# Patient Record
Sex: Female | Born: 1985 | Race: Black or African American | Hispanic: No | Marital: Single | Smoking: Current every day smoker
Health system: Southern US, Community
[De-identification: ages and names within clinical notes are randomized; demographics above are authoritative.]

## PROBLEM LIST (undated history)

## (undated) DIAGNOSIS — F419 Anxiety disorder, unspecified: Secondary | ICD-10-CM

## (undated) DIAGNOSIS — M419 Scoliosis, unspecified: Secondary | ICD-10-CM

## (undated) DIAGNOSIS — F209 Schizophrenia, unspecified: Secondary | ICD-10-CM

## (undated) HISTORY — PX: TUBAL LIGATION: SHX77

---

## 2012-10-26 ENCOUNTER — Encounter (HOSPITAL_COMMUNITY): Payer: Self-pay | Admitting: Emergency Medicine

## 2012-10-26 ENCOUNTER — Inpatient Hospital Stay (HOSPITAL_COMMUNITY)
Admission: AD | Admit: 2012-10-26 | Discharge: 2012-10-26 | Disposition: A | Discharge status: Home / Self Care | Payer: Medicare Other | Source: Ambulatory Visit | Attending: Emergency Medicine | Admitting: Emergency Medicine

## 2012-10-26 DIAGNOSIS — H538 Other visual disturbances: Secondary | ICD-10-CM | POA: Insufficient documentation

## 2012-10-26 DIAGNOSIS — M79609 Pain in unspecified limb: Secondary | ICD-10-CM | POA: Insufficient documentation

## 2012-10-26 DIAGNOSIS — F329 Major depressive disorder, single episode, unspecified: Secondary | ICD-10-CM | POA: Insufficient documentation

## 2012-10-26 DIAGNOSIS — R110 Nausea: Secondary | ICD-10-CM | POA: Insufficient documentation

## 2012-10-26 DIAGNOSIS — F17200 Nicotine dependence, unspecified, uncomplicated: Secondary | ICD-10-CM | POA: Insufficient documentation

## 2012-10-26 DIAGNOSIS — F209 Schizophrenia, unspecified: Secondary | ICD-10-CM | POA: Insufficient documentation

## 2012-10-26 DIAGNOSIS — Z3202 Encounter for pregnancy test, result negative: Secondary | ICD-10-CM | POA: Insufficient documentation

## 2012-10-26 DIAGNOSIS — F3289 Other specified depressive episodes: Secondary | ICD-10-CM | POA: Insufficient documentation

## 2012-10-26 DIAGNOSIS — Z91040 Latex allergy status: Secondary | ICD-10-CM | POA: Insufficient documentation

## 2012-10-26 DIAGNOSIS — F410 Panic disorder [episodic paroxysmal anxiety] without agoraphobia: Secondary | ICD-10-CM | POA: Insufficient documentation

## 2012-10-26 DIAGNOSIS — R42 Dizziness and giddiness: Secondary | ICD-10-CM | POA: Insufficient documentation

## 2012-10-26 DIAGNOSIS — R209 Unspecified disturbances of skin sensation: Secondary | ICD-10-CM | POA: Insufficient documentation

## 2012-10-26 DIAGNOSIS — Z862 Personal history of diseases of the blood and blood-forming organs and certain disorders involving the immune mechanism: Secondary | ICD-10-CM | POA: Insufficient documentation

## 2012-10-26 HISTORY — DX: Schizophrenia, unspecified: F20.9

## 2012-10-26 HISTORY — DX: Anxiety disorder, unspecified: F41.9

## 2012-10-26 LAB — URINALYSIS, ROUTINE W REFLEX MICROSCOPIC
Bilirubin Urine: NEGATIVE
Glucose, UA: NEGATIVE mg/dL
Ketones, ur: NEGATIVE mg/dL
Nitrite: NEGATIVE
Specific Gravity, Urine: 1.015 (ref 1.005–1.030)
pH: 7 (ref 5.0–8.0)

## 2012-10-26 MED ORDER — LORAZEPAM 1 MG PO TABS
1.0000 mg | ORAL_TABLET | Freq: Once | ORAL | Status: AC
  Administered 2012-10-26: 1 mg via ORAL
  Filled 2012-10-26: qty 1

## 2012-10-26 NOTE — MAU Note (Signed)
Pt in with "anxiety attack". Respirations short and shallow. Pt. Not verbally responsive to questions at this time. Some partial words uttered but not complete responsive. Pt. Able to get out of wheelchair and get onto stretcher. Encouraged to take slow, deep breaths.

## 2012-10-26 NOTE — ED Notes (Signed)
MD at bedside. 
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
## 2012-10-26 NOTE — MAU Note (Signed)
Pt eating crackers, drinking ginger ale.
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
## 2012-10-26 NOTE — MAU Note (Signed)
Pt's sibling being seen at clinic.  Pt has GAD, shaking- brought up to MAU for eval.  Pt in wc- taken directly to rm by tech and RN

## 2012-10-26 NOTE — ED Provider Notes (Signed)
CSN: [id number]     Arrival date & time 10/26/12  8469 History   First MD Initiated Contact with Patient 10/26/12 1134     Chief Complaint  Patient presents with  . Panic Attack   (Consider location/radiation/quality/duration/timing/severity/associated sxs/prior Treatment) Patient is a 27 y.o. female presenting with mental health disorder.  Mental Health Problem Presenting symptoms: no suicidal thoughts, no suicidal threats and no suicide attempt   Presenting symptoms comment:  Panic Attack  Degree of incapacity (severity):  Mild Onset quality:  Sudden Timing:  Constant Progression:  Improving Chronicity:  Chronic Context: stressful life event   Context: not noncompliant   Treatment compliance:  Some of the time Relieved by:  Nothing Worsened by:  Nothing tried Ineffective treatments:  None tried Associated symptoms: no abdominal pain and no chest pain     Past Medical History  Diagnosis Date  . Schizophrenia   . Anxiety    Past Surgical History  Procedure Laterality Date  . Tubal ligation     Family History  Problem Relation Age of Onset  . Bone cancer Mother   . Anxiety disorder Father    History  Substance Use Topics  . Smoking status: Current Every Day Smoker -- 3.00 packs/day    Types: Cigarettes  . Smokeless tobacco: Never Used  . Alcohol Use: No   OB History   Grav Para Term Preterm Abortions TAB SAB Ect Mult Living                 Review of Systems  Constitutional: Negative for fever.  Respiratory: Negative for cough and shortness of breath.   Cardiovascular: Negative for chest pain.  Gastrointestinal: Negative for vomiting and abdominal pain.  Psychiatric/Behavioral: Negative for suicidal ideas.  All other systems reviewed and are negative.    Allergies  Latex  Home Medications  No current outpatient prescriptions on file. BP 105/57  Pulse 68  Temp(Src) 98.1 F (36.7 C) (Oral)  Resp 16  Ht 5' (1.524 m)  Wt 115 lb (52.164 kg)  BMI  22.46 kg/m2  SpO2 100%  LMP 09/04/2012 Physical Exam  Nursing note and vitals reviewed. Constitutional: She is oriented to person, place, and time. She appears well-developed and well-nourished. No distress.  HENT:  Head: Normocephalic and atraumatic.  Eyes: EOM are normal. Pupils are equal, round, and reactive to light.  Neck: Normal range of motion. Neck supple.  Cardiovascular: Normal rate and regular rhythm.  Exam reveals no friction rub.   No murmur heard. Pulmonary/Chest: Effort normal and breath sounds normal. No respiratory distress. She has no wheezes. She has no rales.  Abdominal: Soft. She exhibits no distension. There is no tenderness. There is no rebound.  Musculoskeletal: Normal range of motion. She exhibits no edema.  Neurological: She is alert and oriented to person, place, and time.  Skin: No rash noted. She is not diaphoretic.    ED Course  Procedures (including critical care time) Labs Review Labs Reviewed  URINALYSIS, ROUTINE W REFLEX MICROSCOPIC  POCT PREGNANCY, URINE   Imaging Review No results found.  EKG Interpretation   None       MDM   1. Panic attack    76F sent from [facility] for concerns of anxiety. Was not being seen as a patient, her sister was being seen and argument broke out. It triggered patient's anxiety. She is calm, cooperative here, states she is feeling a little better. Mild sharp central chest pain, states terrible sharp central chest pain when  she has her other anxiety attacks.  Will give Ativan for her anxiety. After Ativan, resting comfortably, states she is feeling better. Denies any SI/HI. Given f/u resources.   Dagmar Hait, MD 10/26/12 1309
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
## 2012-10-26 NOTE — MAU Note (Signed)
Patient states a diagnosis of schizophrenia at [facility]. States inherited from father.

## 2012-12-01 ENCOUNTER — Emergency Department (HOSPITAL_COMMUNITY)
Admission: EM | Admit: 2012-12-01 | Discharge: 2012-12-01 | Discharge status: Against Medical Advice | Payer: Medicare Other | Attending: Emergency Medicine | Admitting: Emergency Medicine

## 2012-12-01 ENCOUNTER — Encounter (HOSPITAL_COMMUNITY): Payer: Self-pay | Admitting: Emergency Medicine

## 2012-12-01 DIAGNOSIS — F29 Unspecified psychosis not due to a substance or known physiological condition: Secondary | ICD-10-CM | POA: Insufficient documentation

## 2012-12-01 DIAGNOSIS — Z3202 Encounter for pregnancy test, result negative: Secondary | ICD-10-CM | POA: Insufficient documentation

## 2012-12-01 DIAGNOSIS — F17200 Nicotine dependence, unspecified, uncomplicated: Secondary | ICD-10-CM | POA: Diagnosis not present

## 2012-12-01 LAB — COMPREHENSIVE METABOLIC PANEL
AST: 17 U/L (ref 0–37)
Albumin: 4.4 g/dL (ref 3.5–5.2)
BUN: 8 mg/dL (ref 6–23)
CO2: 25 mEq/L (ref 19–32)
Calcium: 10.1 mg/dL (ref 8.4–10.5)
Chloride: 105 mEq/L (ref 96–112)
Creatinine, Ser: 0.95 mg/dL (ref 0.50–1.10)
GFR calc Af Amer: >90 mL/min (ref >90)
GFR calc non Af Amer: 83 mL/min — ABNORMAL LOW (ref >90)
Glucose, Bld: 79 mg/dL (ref 70–99)
Total Bilirubin: 0.5 mg/dL (ref 0.3–1.2)

## 2012-12-01 LAB — URINALYSIS, ROUTINE W REFLEX MICROSCOPIC
Glucose, UA: NEGATIVE mg/dL
Hgb urine dipstick: NEGATIVE
Leukocytes, UA: NEGATIVE
Nitrite: NEGATIVE
Protein, ur: NEGATIVE mg/dL
Specific Gravity, Urine: 1.027 (ref 1.005–1.030)
Urobilinogen, UA: 1 mg/dL (ref 0.0–1.0)

## 2012-12-01 LAB — CBC WITH DIFFERENTIAL/PLATELET
Basophils Absolute: 0 10*3/uL (ref 0.0–0.1)
Basophils Relative: 0 % (ref 0–1)
HCT: 39.9 % (ref 36.0–46.0)
Hemoglobin: 13.4 g/dL (ref 12.0–15.0)
Lymphocytes Relative: 60 % — ABNORMAL HIGH (ref 12–46)
MCHC: 33.6 g/dL (ref 30.0–36.0)
Monocytes Relative: 7 % (ref 3–12)
Neutro Abs: 1.6 10*3/uL — ABNORMAL LOW (ref 1.7–7.7)
Neutrophils Relative %: 31 % — ABNORMAL LOW (ref 43–77)
RBC: 5.03 MIL/uL (ref 3.87–5.11)
RDW: 15.6 % — ABNORMAL HIGH (ref 11.5–15.5)
WBC: 5.2 10*3/uL (ref 4.0–10.5)

## 2012-12-01 LAB — RAPID URINE DRUG SCREEN, HOSP PERFORMED
Amphetamines: NOT DETECTED
Benzodiazepines: NOT DETECTED
Cocaine: NOT DETECTED
Opiates: NOT DETECTED
Tetrahydrocannabinol: POSITIVE — AB

## 2012-12-01 LAB — PREGNANCY, URINE: Preg Test, Ur: NEGATIVE

## 2012-12-01 NOTE — ED Notes (Signed)
Called x's 3 without response 

## 2012-12-01 NOTE — ED Notes (Signed)
The pt reports that her head is messed up and she cannot seem to stop the images.  She has a child with her

## 2013-12-01 ENCOUNTER — Encounter (HOSPITAL_COMMUNITY): Payer: Self-pay | Admitting: Emergency Medicine

## 2013-12-01 ENCOUNTER — Emergency Department (HOSPITAL_COMMUNITY)
Admission: EM | Admit: 2013-12-01 | Discharge: 2013-12-01 | Disposition: A | Discharge status: Home / Self Care | Payer: Medicare Other | Attending: Emergency Medicine | Admitting: Emergency Medicine

## 2013-12-01 DIAGNOSIS — Z9851 Tubal ligation status: Secondary | ICD-10-CM | POA: Diagnosis not present

## 2013-12-01 DIAGNOSIS — Z91040 Latex allergy status: Secondary | ICD-10-CM | POA: Insufficient documentation

## 2013-12-01 DIAGNOSIS — Z720 Tobacco use: Secondary | ICD-10-CM | POA: Insufficient documentation

## 2013-12-01 DIAGNOSIS — Z8659 Personal history of other mental and behavioral disorders: Secondary | ICD-10-CM | POA: Diagnosis not present

## 2013-12-01 DIAGNOSIS — K602 Anal fissure, unspecified: Secondary | ICD-10-CM | POA: Diagnosis not present

## 2013-12-01 DIAGNOSIS — R1084 Generalized abdominal pain: Secondary | ICD-10-CM | POA: Insufficient documentation

## 2013-12-01 DIAGNOSIS — R112 Nausea with vomiting, unspecified: Secondary | ICD-10-CM | POA: Diagnosis not present

## 2013-12-01 DIAGNOSIS — K625 Hemorrhage of anus and rectum: Secondary | ICD-10-CM | POA: Diagnosis present

## 2013-12-01 DIAGNOSIS — K644 Residual hemorrhoidal skin tags: Secondary | ICD-10-CM | POA: Diagnosis not present

## 2013-12-01 LAB — I-STAT BETA HCG BLOOD, ED (MC, WL, AP ONLY): I-stat hCG, quantitative: <5 m[IU]/mL (ref <5)

## 2013-12-01 LAB — HEPATIC FUNCTION PANEL
ALT: 17 U/L (ref 0–35)
AST: 21 U/L (ref 0–37)
Albumin: 3.3 g/dL — ABNORMAL LOW (ref 3.5–5.2)
Alkaline Phosphatase: 42 U/L (ref 39–117)
BILIRUBIN TOTAL: 0.2 mg/dL — AB (ref 0.3–1.2)
Total Protein: 6.8 g/dL (ref 6.0–8.3)

## 2013-12-01 LAB — URINALYSIS, ROUTINE W REFLEX MICROSCOPIC
Glucose, UA: NEGATIVE mg/dL
KETONES UR: 15 mg/dL — AB
Leukocytes, UA: NEGATIVE
NITRITE: NEGATIVE
PROTEIN: 30 mg/dL — AB
SPECIFIC GRAVITY, URINE: 1.036 — AB (ref 1.005–1.030)
UROBILINOGEN UA: 1 mg/dL (ref 0.0–1.0)
pH: 6 (ref 5.0–8.0)

## 2013-12-01 LAB — BASIC METABOLIC PANEL
Anion gap: 14 (ref 5–15)
BUN: 9 mg/dL (ref 6–23)
CALCIUM: 9.4 mg/dL (ref 8.4–10.5)
CO2: 24 meq/L (ref 19–32)
CREATININE: 0.9 mg/dL (ref 0.50–1.10)
Chloride: 105 mEq/L (ref 96–112)
GFR calc Af Amer: >90 mL/min (ref >90)
GFR calc non Af Amer: 87 mL/min — ABNORMAL LOW (ref >90)
Glucose, Bld: 96 mg/dL (ref 70–99)
Potassium: 3.7 mEq/L (ref 3.7–5.3)
Sodium: 143 mEq/L (ref 137–147)

## 2013-12-01 LAB — CBC WITH DIFFERENTIAL/PLATELET
BASOS ABS: 0 10*3/uL (ref 0.0–0.1)
Basophils Relative: 1 % (ref 0–1)
Eosinophils Absolute: 0.1 10*3/uL (ref 0.0–0.7)
Eosinophils Relative: 2 % (ref 0–5)
HCT: 34 % — ABNORMAL LOW (ref 36.0–46.0)
Hemoglobin: 11 g/dL — ABNORMAL LOW (ref 12.0–15.0)
LYMPHS PCT: 34 % (ref 12–46)
Lymphs Abs: 2.2 10*3/uL (ref 0.7–4.0)
MCH: 25.8 pg — ABNORMAL LOW (ref 26.0–34.0)
MCHC: 32.4 g/dL (ref 30.0–36.0)
MCV: 79.8 fL (ref 78.0–100.0)
MONO ABS: 0.7 10*3/uL (ref 0.1–1.0)
MONOS PCT: 11 % (ref 3–12)
Neutro Abs: 3.5 10*3/uL (ref 1.7–7.7)
Neutrophils Relative %: 52 % (ref 43–77)
Platelets: 219 10*3/uL (ref 150–400)
RBC: 4.26 MIL/uL (ref 3.87–5.11)
RDW: 16 % — AB (ref 11.5–15.5)
WBC: 6.5 10*3/uL (ref 4.0–10.5)

## 2013-12-01 LAB — URINE MICROSCOPIC-ADD ON

## 2013-12-01 LAB — LIPASE, BLOOD: Lipase: 32 U/L (ref 11–59)

## 2013-12-01 LAB — POC OCCULT BLOOD, ED: Fecal Occult Bld: POSITIVE — AB

## 2013-12-01 MED ORDER — SODIUM CHLORIDE 0.9 % IV BOLUS (SEPSIS)
1000.0000 mL | Freq: Once | INTRAVENOUS | Status: AC
  Administered 2013-12-01: 1000 mL via INTRAVENOUS

## 2013-12-01 MED ORDER — ONDANSETRON HCL 4 MG/2ML IJ SOLN
4.0000 mg | Freq: Once | INTRAMUSCULAR | Status: AC
Start: 2013-12-01 — End: 2013-12-01
  Administered 2013-12-01: 4 mg via INTRAVENOUS
  Filled 2013-12-01: qty 2

## 2013-12-01 MED ORDER — MORPHINE SULFATE 4 MG/ML IJ SOLN
4.0000 mg | Freq: Once | INTRAMUSCULAR | Status: AC
  Administered 2013-12-01: 4 mg via INTRAVENOUS
  Filled 2013-12-01: qty 1

## 2013-12-01 MED ORDER — DOCUSATE SODIUM 100 MG PO CAPS: 100.0000 mg | ORAL_CAPSULE | Freq: Two times a day (BID) | ORAL | Status: DC

## 2013-12-01 MED ORDER — OXYCODONE-ACETAMINOPHEN 5-325 MG PO TABS
1.0000 | ORAL_TABLET | Freq: Once | ORAL | Status: AC
  Administered 2013-12-01: 1 via ORAL
  Filled 2013-12-01: qty 1

## 2013-12-01 NOTE — Discharge Instructions (Signed)
Return to the emergency room with worsening of symptoms, new symptoms or with symptoms that are concerning, especially fevers, chills, severe abdominal pain, unable to keep down fluids, loosing large amounts of blood from rectum. Follow up with the wellness Center. Call to make an appointment soon as possible. Number provided above. Stool softener and fiber supplement such as Metamucil daily. Do not straining with defecation. Sitz bath after defecation. Instructions below.    Emergency Department Resource Guide 1) Find a Doctor and Pay Out of Pocket Although you won't have to find out who is covered by your insurance plan, it is a good idea to ask around and get recommendations. You will then need to call the office and see if the doctor you have chosen will accept you as a new patient and what types of options they offer for patients who are self-pay. Some doctors offer discounts or will set up payment plans for their patients who do not have insurance, but you will need to ask so you aren't surprised when you get to your appointment.  2) Contact Your Local Health Department Not all health departments have doctors that can see patients for sick visits, but many do, so it is worth a call to see if yours does. If you don't know where your local health department is, you can check in your phone book. The CDC also has a tool to help you locate your state's health department, and many state websites also have listings of all of their local health departments.  3) Find a Walk-in Clinic If your illness is not likely to be very severe or complicated, you may want to try a walk in clinic. These are popping up all over the country in pharmacies, drugstores, and shopping centers. They're usually staffed by nurse practitioners or physician assistants that have been trained to treat common illnesses and complaints. They're usually fairly quick and inexpensive. However, if you have serious medical issues or chronic  medical problems, these are probably not your best option.  No Primary Care Doctor: - Call Health Connect at  [phone] - they can help you locate a primary care doctor that  accepts your insurance, provides certain services, etc. - Physician Referral Service- [phone]  Chronic Pain Problems: Organization         Address  Phone   Notes  [facility]  [phone]) [phone] Patients need to be referred by their primary care doctor.   Medication Assistance: Organization         Address  Phone   Notes  [facility] [address] [place], [place] [address] [phone]) [phone] --Must be a resident of [facility] -- Must have NO insurance coverage whatsoever (no Medicaid/ Medicare, etc.) -- The pt. MUST have a primary care doctor that directs their care regularly and follows them in the community   Med[facility]ist  [phone]   [employer]  [phone]    Agencies that provide inexpensive medical care: Organization         Address  Phone   Notes  [facility]  [phone]) [phone]   [facility]    [phone]   [facility] [address] [place], [place] [address] [phone]) [phone]   [facility] [address], [place] [phone]   [facility]anned Parenthood    [phone](336) [phone]   [facility]    [phone](336) [phone]   [facility]  [address], [place] Phone:  [phone], Fax:  [phone] Hours of Operation:  9 am - 6 pm, M-F.  Also accepts Medicaid/Medicare and self-pay.  [facility]  [address], [place] Phone: [phone], Fax: [phone]) [phone]. Hours of Operation:  8:30 am - 5:30 pm, M-F.  Also accepts Medicaid and self-pay.  [facility] [place] [address], [place] Phone: [phone](336) [phone]   [facility] [address], [place], [place] [phone],  Ext. [phone] Mondays & Thursdays: 7-9 AM.  First 15 patients are seen on a first come, first serve basis.    Medicaid-accepting [facility] Providers:  Organization         Address  Phone   Notes  [facility] [address], [place] [phone]) [phone] Also accepts self-pay patients.  [facility] [address], [place]  [phone]) [phone]   [facility] [address], [place] [phone]   [facility] [address] [place] [address], [place] [phone]) [phone]   Renaye RakersVeita Bland [address], [place]   [phone]) [phone] Only accepts [place] Access [place] patients after they have their name applied to their card.   Self-Pay (no insurance) in [facility]:  Organization         Address  Phone   Notes  Sickle Cell Patients, [facility] [address], [place] [phone]   [facility] [address], [place] [phone]) [phone]   [facility] [place]  [address], [place] [phone]) [phone]   [facility]/Dr. Osei-Bonsu  [address] [place] [address], [place] or [id number], [place] [phone] Phone number for both [place] and [place] locations is the same.  [facility] [address], [place] [phone]   [facility] [address] [place] [address], [place] or [address] [phone]) [phone]   [facility] [address] [place] [address], [place] [phone], phone; [phone]) [phone], fax Sees patients 1st and 3rd Saturday of every month.  Must not qualify for public or private insurance (i.e. Medicaid, Medicare, [place] Health Choice, Veterans' Benefits)  Household income should be no more than 200% of the poverty level The clinic cannot treat you if you are pregnant or think you are pregnant  Sexually transmitted diseases are not  treated at the clinic.    Dental Care: Organization         Address  Phone  Notes  [facility] Department of [facility] [address], [place] [phone] Accepts children up to age 721 who are enrolled in [place] or [place] Health Choice; pregnant women with a Medicaid card; and children who have applied for Medicaid or [place] Health Choice, but were declined, whose parents can pay a reduced fee at time of service.  [facility] of [facility] [place]  [address], [place] [phone]) [phone] Accepts children up to age 26 who are enrolled in [place] or [place] Health Choice; pregnant women with a Medicaid card; and children who have applied for Medicaid or [place] Health Choice, but were declined, whose parents can pay a reduced fee at time of service.  [facility]  [phone] [address], [place] [phone] Patients are seen by appointment only. Walk-ins are not accepted. [facility] will see patients 75 years of age and older. Monday - Tuesday (8am-5pm) Most Wednesdays (8:30-5pm) $30 per visit, cash only  [facility] Access PROGRAM  [address], [facility] [phone] Patients are seen by appointment only. Walk-ins are not accepted. [facility] will see patients 45 years of age and older. One Wednesday Evening (Monthly: Volunteer Based).  $30 per visit, cash only  [employer]  [phone] for adults; Children under age 15, call Graduate Pediatric Dentistry at [phone]. Children aged 79-14, please call [phone] to request a pediatric application.  Dental services are provided in all areas of dental care including fillings, crowns and bridges, complete and partial dentures, implants, gum treatment, root canals, and extractions. Preventive care is also provided. Treatment is provided to both adults and children. Patients are selected via a lottery and there is  often a waiting list.   [facility] [address] [place]  [phone] [email or web address]   [facility] [address] [place], [place] [phone], Ext. [phone] Second and Fourth Thursday of each month, opens at 6:30 AM; Clinic ends at 9 AM.  Patients are seen on a first-come first-served basis, and a limited number are seen during each clinic.   [facility]  [address] [place], [place] [phone]   Eligibility Requirements You must have lived in [place], [place], or [place] counties for at least the last three months.   You cannot be eligible for state or federal sponsored [place], including [employer], [place], or [employer].   You generally cannot be eligible for healthcare insurance through your employer.    How to apply: Eligibility screenings are held every Tuesday and Wednesday afternoon from 1:00 pm until 4:00 pm. You do not need an appointment for the interview!  [facility] [address], [place], [place] [phone]   [facility] Health Department  [phone]   [facility] Health Department  [phone]   [facility] Department  [phone]    Behavioral Health Resources in the Community: Intensive Outpatient Programs Organization         Address  Phone  Notes  [facility] [address], [place], [place] [phone]   [facility] [address] [place], [place] [phone]   [facility] [address] [place], [place]  [phone]   [facility] [address],  [place], [place] [phone] or [phone]   Substance Abuse Resources Organization         Address  Phone  Notes  [facility]  [phone]   [facility]  [phone]   [facility] [place]  [phone]   Floydene Flock  [phone]   Residential & Outpatient Substance  Abuse Program  [phone]   Psychological Services Organization         Address  Phone  Notes  [facility]  [phone]   [facility]  [phone]   [facility] [address], [place] [phone] or [phone]    Mobile Crisis Teams Organization         Address  Phone  Notes  [facility], Mobile Crisis Care Unit  [phone]   [facility]  [address] [place], [place] [phone]   Jasmine December  DeEsch [address] [place] [phone]    Self-Help/Support Groups Organization         Address  Phone             Notes  [facility] [place] - variety of support groups  [phone]- I7437963 Call for more information  Narcotics Anonymous (NA), [facility] [address], [employer] [place]  2 meetings at this location   Statistician         Address  Phone  Notes  [facility] [address],    [place]  [phone]   [facility]  [address] [place] [address], [place], [place] [phone]   [facility] [address] [place], [place] [phone] Admissions: 8am-3pm M-F  [facility] [address]    [place], [place] [phone]   [facility] [address] [place], [place], [place] [phone]   [facility] [address]  [place], [place] [phone]   [facility] - Intensive Outpatient [address], [place], [place] [phone]   [facility] ([facility] [address]  [place], [place] [phone] or [phone]   [facility] ([facility]) [address], [place], [place] [phone] Accepts Medicaid  [facility] [address],  [place] [phone] Substance Abuse/Addiction Treatment   [facility] Organization          Address  Phone  Notes  [facility]  [phone]   Angie Fava, PhD [address]vin Knack [place], [place]   [phone] or [phone]   [facility]   [address] [place], [place] [phone]   [facility] [address] [place], [place] [phone] Insurance/Medicaid/sponsorship through [facility] and Families [address]                                    [place], [place] [phone] Therapy/tele-psych/case  [facility] [address], [place] [phone]    Dr. Lolly Mustache  [phone]   [facility]  [facility] Dept. 1) [address], [place] 2) [address] [place] 3)  [address], [place] [phone]  [phone]   [facility] Child Abuse Hotline (848)049-9944 or 937-152-7158 (After Hours)         Abdominal Pain, Women Abdominal (stomach, pelvic, or belly) pain can be caused by many things. It is important to tell your doctor:  The location of the pain.  Does it come and go or is it present all the time?  Are there things that start the pain (eating certain foods, exercise)?  Are there other symptoms associated with the pain (fever, nausea, vomiting, diarrhea)? All of this is helpful to know when trying to find the cause of the pain. CAUSES   Stomach: virus or bacteria infection, or ulcer.  Intestine: appendicitis (inflamed appendix), regional ileitis (Crohn's disease), ulcerative colitis (inflamed colon), irritable bowel syndrome, diverticulitis (inflamed diverticulum of the colon), or cancer of the stomach or intestine.  Gallbladder disease or stones in the gallbladder.  Kidney disease, kidney stones, or infection.  Pancreas infection or cancer.  Fibromyalgia (pain disorder).  Diseases of the female organs:  Uterus:  fibroid (non-cancerous) tumors or infection.  Fallopian tubes: infection or tubal  pregnancy.  Ovary: cysts or tumors.  Pelvic adhesions (scar tissue).  Endometriosis (uterus lining tissue growing in the pelvis and on the pelvic organs).  Pelvic congestion syndrome (female organs filling up with blood just before the menstrual period).  Pain with the menstrual period.  Pain with ovulation (producing an egg).  Pain with an IUD (intrauterine device, birth control) in the uterus.  Cancer of the female organs.  Functional pain (pain not caused by a disease, may improve without treatment).  Psychological pain.  Depression. DIAGNOSIS  Your doctor will decide the seriousness of your pain by doing an examination.  Blood tests.  X-rays.  Ultrasound.  CT scan (computed tomography, special type of X-ray).  MRI (magnetic resonance imaging).  Cultures, for infection.  Barium enema (dye inserted in the large intestine, to better view it with X-rays).  Colonoscopy (looking in intestine with a lighted tube).  Laparoscopy (minor surgery, looking in abdomen with a lighted tube).  Major abdominal exploratory surgery (looking in abdomen with a large incision). TREATMENT  The treatment will depend on the cause of the pain.   Many cases can be observed and treated at home.  Over-the-counter medicines recommended by your caregiver.  Prescription medicine.  Antibiotics, for infection.  Birth control pills, for painful periods or for ovulation pain.  Hormone treatment, for endometriosis.  Nerve blocking injections.  Physical therapy.  Antidepressants.  Counseling with a psychologist or psychiatrist.  Minor or major surgery. HOME CARE INSTRUCTIONS   Do not take laxatives, unless directed by your caregiver.  Take over-the-counter pain medicine only if ordered by your caregiver. Do not take aspirin because it can cause an upset stomach or bleeding.  Try a clear liquid diet (broth or water) as ordered by your caregiver. Slowly move to a bland diet, as  tolerated, if the pain is related to the stomach or intestine.  Have a thermometer and take your temperature several times a day, and record it.  Bed rest and sleep, if it helps the pain.  Avoid sexual intercourse, if it causes pain.  Avoid stressful situations.  Keep your follow-up appointments and tests, as your caregiver orders.  If the pain does not go away with medicine or surgery, you may try:  Acupuncture.  Relaxation exercises (yoga, meditation).  Group therapy.  Counseling. SEEK MEDICAL CARE IF:   You notice certain foods cause stomach pain.  Your home care treatment is not helping your pain.  You need stronger pain medicine.  You want your IUD removed.  You feel faint or lightheaded.  You develop nausea and vomiting.  You develop a rash.  You are having side effects or an allergy to your medicine. SEEK IMMEDIATE MEDICAL CARE IF:   Your pain does not go away or gets worse.  You have a fever.  Your pain is felt only in portions of the abdomen. The right side could possibly be appendicitis. The left lower portion of the abdomen could be colitis or diverticulitis.  You are passing blood in your stools (bright red or black tarry stools, with or without vomiting).  You have blood in your urine.  You develop chills, with or without a fever.  You pass out. MAKE SURE YOU:   Understand these instructions.  Will watch your condition.  Will get help right away if you are not doing well or get worse. Document Released: 11/01/2006 Document Revised: 05/21/2013 Document Reviewed: 11/21/2008 ExitCare Patient  Information 2015 [place], [place]. This information is not intended to replace advice given to you by your health care provider. Make sure you discuss any questions you have with your health care provider.    Anal Fissure, Adult An anal fissure is a small tear or crack in the skin around the anus. Bleeding from a fissure usually stops on its own within a  few minutes. However, bleeding will often reoccur with each bowel movement until the crack heals.  CAUSES   Passing large, hard stools.  Frequent diarrheal stools.  Constipation.  Inflammatory bowel disease (Crohn's disease or ulcerative colitis).  Infections.  Anal sex. SYMPTOMS   Small amounts of blood seen on your stools, on toilet paper, or in the toilet after a bowel movement.  Rectal bleeding.  Painful bowel movements.  Itching or irritation around the anus. DIAGNOSIS Your caregiver will examine the anal area. An anal fissure can usually be seen with careful inspection. A rectal exam may be performed and a short tube (anoscope) may be used to examine the anal canal. TREATMENT   You may be instructed to take fiber supplements. These supplements can soften your stool to help make bowel movements easier.  Sitz baths may be recommended to help heal the tear. Do not use soap in the sitz baths.  A medicated cream or ointment may be prescribed to lessen discomfort. HOME CARE INSTRUCTIONS   Maintain a diet high in fruits, whole grains, and vegetables. Avoid constipating foods like bananas and dairy products.  Take sitz baths as directed by your caregiver.  Drink enough fluids to keep your urine clear or pale yellow.  Only take over-the-counter or prescription medicines for pain, discomfort, or fever as directed by your caregiver. Do not take aspirin as this may increase bleeding.  Do not use ointments containing numbing medications (anesthetics) or hydrocortisone. They could slow healing. SEEK MEDICAL CARE IF:   Your fissure is not completely healed within 3 days.  You have further bleeding.  You have a fever.  You have diarrhea mixed with blood.  You have pain.  Your problem is getting worse rather than better. MAKE SURE YOU:   Understand these instructions.  Will watch your condition.  Will get help right away if you are not doing well or get  worse. Document Released: 01/04/2005 Document Revised: 03/29/2011 Document Reviewed: 06/21/2010 [facility] Patient Information 2015 [place], [place]. This information is not intended to replace advice given to you by your health care provider. Make sure you discuss any questions you have with your health care provider.

## 2013-12-01 NOTE — ED Notes (Addendum)
Pt reports rectal bleeding x 2 days when she uses the restroom. Also c/o abdominal pain and sts she vomited once. Pt is tearful in triage.

## 2013-12-01 NOTE — ED Notes (Signed)
PA at bedside completing rectal exam.

## 2013-12-01 NOTE — ED Provider Notes (Signed)
CSN: [id number]     Arrival date & time 12/01/13  0235 History   First MD Initiated Contact with Patient 12/01/13 [phone]     Chief Complaint  Patient presents with  . Rectal Bleeding     (Consider location/radiation/quality/duration/timing/severity/associated sxs/prior Treatment) HPI  Leslie Jarvis is a 28 y.o. female with PMH of anxiety presenting with 2 day history of rectal bleeding after defecation. Patient endorses history of constipation with hard stools and painful defecation. Patient says there is a small amount of blood on the toilet paper. At times her stools loose. This is also associated with a sharp diffuse abdominal pain that was intermittent and has become more constant. Patient has vomited once yesterday. Emesis was nonbloody nonbilious. Last bowel movement yesterday. Patient endorses mild burning with urination. She states this is normal for her. No hematuria. No foul smell to urine. No urgency or frequency. Patient denies vaginal complaints. She denies fevers but endorses chills. Patient denies chest pain, shortness of breath, lightheadedness or dizziness.   Past Medical History  Diagnosis Date  . Schizophrenia   . Anxiety    Past Surgical History  Procedure Laterality Date  . Tubal ligation     Family History  Problem Relation Age of Onset  . Bone cancer Mother   . Anxiety disorder Father    History  Substance Use Topics  . Smoking status: Current Every Day Smoker -- 3.00 packs/day    Types: Cigarettes  . Smokeless tobacco: Never Used  . Alcohol Use: No   OB History    No data available     Review of Systems  Constitutional: Negative for fever and chills.  HENT: Negative for congestion and rhinorrhea.   Eyes: Negative for visual disturbance.  Respiratory: Negative for cough and shortness of breath.   Cardiovascular: Negative for chest pain and palpitations.  Gastrointestinal: Positive for nausea, vomiting and blood in stool.  Genitourinary: Negative  for dysuria and hematuria.  Musculoskeletal: Negative for back pain and gait problem.  Skin: Negative for rash.  Neurological: Negative for weakness and headaches.      Allergies  Latex  Home Medications   Prior to Admission medications   Medication Sig Start Date End Date Taking? Authorizing Provider  docusate sodium (COLACE) 100 MG capsule Take 1 capsule (100 mg total) by mouth every 12 (twelve) hours. 12/01/13   Benetta SparVictoria L Dakin Madani, PA-C   BP 107/59 mmHg  Pulse 55  Temp(Src) 97.1 F (36.2 C) (Oral)  Resp 18  Ht 5' (1.524 m)  Wt 119 lb (53.978 kg)  BMI 23.24 kg/m2  SpO2 100%  LMP 11/30/2013 Physical Exam  Constitutional: She appears well-developed and well-nourished. No distress.  HENT:  Head: Normocephalic and atraumatic.  Mildly dry mucous membranes  Eyes: Conjunctivae and EOM are normal. Right eye exhibits no discharge. Left eye exhibits no discharge.  Cardiovascular: Normal rate, regular rhythm and normal heart sounds.   Pulmonary/Chest: Effort normal and breath sounds normal. No respiratory distress. She has no wheezes.  Abdominal: Soft. Bowel sounds are normal. She exhibits no distension.  Diffuse abdominal tenderness worse in left lower quadrant. No rebound, guarding, rigidity. No CVA tenderness.  Genitourinary:  External rectum without tenderness to palpation with 1cm fissure at 6'o clock. Pt also with small external hemorrhoid without tenderness, erythema, swelling, non thrombosed. Internal rectum with normal tone without masses or lesions. Hard stool in rectal vault. Discomfort with rectal exam. Nursing tech in room during exam.   Neurological: She is alert. She  exhibits normal muscle tone. Coordination normal.  Skin: Skin is warm and dry. She is not diaphoretic.  Nursing note and vitals reviewed.   ED Course  Procedures (including critical care time) Labs Review Labs Reviewed  CBC WITH DIFFERENTIAL - Abnormal; Notable for the following:    Hemoglobin 11.0  (*)    HCT 34.0 (*)    MCH 25.8 (*)    RDW 16.0 (*)    All other components within normal limits  BASIC METABOLIC PANEL - Abnormal; Notable for the following:    GFR calc non Af Amer 87 (*)    All other components within normal limits  HEPATIC FUNCTION PANEL - Abnormal; Notable for the following:    Albumin 3.3 (*)    Total Bilirubin 0.2 (*)    All other components within normal limits  URINALYSIS, ROUTINE W REFLEX MICROSCOPIC - Abnormal; Notable for the following:    Color, Urine AMBER (*)    Specific Gravity, Urine 1.036 (*)    Hgb urine dipstick LARGE (*)    Bilirubin Urine SMALL (*)    Ketones, ur 15 (*)    Protein, ur 30 (*)    All other components within normal limits  URINE MICROSCOPIC-ADD ON - Abnormal; Notable for the following:    Squamous Epithelial / LPF FEW (*)    Bacteria, UA FEW (*)    Crystals CA OXALATE CRYSTALS (*)    All other components within normal limits  POC OCCULT BLOOD, ED - Abnormal; Notable for the following:    Fecal Occult Bld POSITIVE (*)    All other components within normal limits  LIPASE, BLOOD  I-STAT BETA HCG BLOOD, ED ([facility], [facility], [facility] ONLY)    Imaging Review No results found.   EKG Interpretation None      Meds given in ED:  Medications  oxyCODONE-acetaminophen (PERCOCET/ROXICET) 5-325 MG per tablet 1 tablet (1 tablet Oral Given 12/01/13 0254)  sodium chloride 0.9 % bolus 1,000 mL (0 mLs Intravenous Stopped 12/01/13 0730)  ondansetron (ZOFRAN) injection 4 mg (4 mg Intravenous Given 12/01/13 0644)  morphine 4 MG/ML injection 4 mg (4 mg Intravenous Given 12/01/13 0648)    Discharge Medication List as of 12/01/2013  8:57 AM    START taking these medications   Details  docusate sodium (COLACE) 100 MG capsule Take 1 capsule (100 mg total) by mouth every 12 (twelve) hours., Starting 12/01/2013, Until Discontinued, Print          MDM   Final diagnoses:  Anal fissure  External hemorrhoid  Diffuse abdominal pain  Nausea and  vomiting, vomiting of unspecified type   Pt with history of small amount of blood on toilet paper after defecation with associated pain. This is gone on for 2 days. Pt with history of constipation. He should also with diffuse abdominal pain. Better after defecation and one episode of emesis yesterday nonbloody nonbilious. VSS. Mild diffuse abdominal tenderness without signs of peritonitis. Pt with small non thrombosed or inflammed external hemorrhoid and small non infected anal fissure. Hemoccult positive. Pt tolerating fluid in ED. Abdominal pain has completely resolved. Pt with benign abdomen. Labs reassuring. I doubt acute abdomen. Pt also with hematuria but pt on her period. Pt with few bacteria in urine and I suspect contaminant. Pt without new urinary symptom. Patient is afebrile, nontoxic, and in no acute distress. Patient is appropriate for outpatient management and is stable for discharge. Tx with sitz baths, stool softener and fiber. Patient without a PCP. Patient to  establish care and follow up. ED resources provided. Pt appears reliable for follow up and is agreeable to discharge.   Discussed return precautions with patient. Discussed all results and patient verbalizes understanding and agrees with plan.  Case has been discussed with Dr. Deretha Emory who agrees with the above plan and to discharge.       Louann Sjogren, PA-C 12/01/13 1610  Loren Racer, MD 12/02/13 [phone]

## 2014-01-14 ENCOUNTER — Encounter (HOSPITAL_COMMUNITY): Payer: Self-pay | Admitting: Family Medicine

## 2014-01-14 ENCOUNTER — Emergency Department (HOSPITAL_COMMUNITY)
Admission: EM | Admit: 2014-01-14 | Discharge: 2014-01-14 | Discharge status: Against Medical Advice | Payer: Medicare Other | Attending: Emergency Medicine | Admitting: Emergency Medicine

## 2014-01-14 DIAGNOSIS — R109 Unspecified abdominal pain: Secondary | ICD-10-CM | POA: Diagnosis not present

## 2014-01-14 DIAGNOSIS — Z720 Tobacco use: Secondary | ICD-10-CM | POA: Insufficient documentation

## 2014-01-14 DIAGNOSIS — R197 Diarrhea, unspecified: Secondary | ICD-10-CM | POA: Insufficient documentation

## 2014-01-14 LAB — CBC WITH DIFFERENTIAL/PLATELET
BASOS ABS: 0 10*3/uL (ref 0.0–0.1)
BASOS PCT: 0 % (ref 0–1)
Eosinophils Absolute: 0 10*3/uL (ref 0.0–0.7)
Eosinophils Relative: 1 % (ref 0–5)
HCT: 36.5 % (ref 36.0–46.0)
Hemoglobin: 12.1 g/dL (ref 12.0–15.0)
Lymphocytes Relative: 31 % (ref 12–46)
Lymphs Abs: 1.7 10*3/uL (ref 0.7–4.0)
MCH: 26 pg (ref 26.0–34.0)
MCHC: 33.2 g/dL (ref 30.0–36.0)
MCV: 78.5 fL (ref 78.0–100.0)
MONOS PCT: 13 % — AB (ref 3–12)
Monocytes Absolute: 0.7 10*3/uL (ref 0.1–1.0)
NEUTROS ABS: 3.1 10*3/uL (ref 1.7–7.7)
NEUTROS PCT: 55 % (ref 43–77)
PLATELETS: 181 10*3/uL (ref 150–400)
RBC: 4.65 MIL/uL (ref 3.87–5.11)
RDW: 16.5 % — AB (ref 11.5–15.5)
WBC: 5.6 10*3/uL (ref 4.0–10.5)

## 2014-01-14 LAB — COMPREHENSIVE METABOLIC PANEL
ALBUMIN: 3.3 g/dL — AB (ref 3.5–5.2)
ALK PHOS: 39 U/L (ref 39–117)
ALT: 13 U/L (ref 0–35)
AST: 18 U/L (ref 0–37)
Anion gap: 5 (ref 5–15)
BUN: 8 mg/dL (ref 6–23)
CALCIUM: 9.3 mg/dL (ref 8.4–10.5)
CO2: 27 mmol/L (ref 19–32)
Chloride: 109 mEq/L (ref 96–112)
Creatinine, Ser: 0.9 mg/dL (ref 0.50–1.10)
GFR calc Af Amer: >90 mL/min (ref >90)
GFR calc non Af Amer: 87 mL/min — ABNORMAL LOW (ref >90)
Glucose, Bld: 96 mg/dL (ref 70–99)
POTASSIUM: 4 mmol/L (ref 3.5–5.1)
SODIUM: 141 mmol/L (ref 135–145)
Total Bilirubin: 0.6 mg/dL (ref 0.3–1.2)
Total Protein: 6.7 g/dL (ref 6.0–8.3)

## 2014-01-14 NOTE — ED Notes (Signed)
Pt sts abd pain, diarrhea and rectal bleeding that started 3 days ago. sts also vomiting.

## 2014-03-26 ENCOUNTER — Emergency Department (HOSPITAL_COMMUNITY)
Admission: EM | Admit: 2014-03-26 | Discharge: 2014-03-26 | Disposition: A | Discharge status: Home / Self Care | Payer: Medicare Other | Attending: Emergency Medicine | Admitting: Emergency Medicine

## 2014-03-26 ENCOUNTER — Encounter (HOSPITAL_COMMUNITY): Payer: Self-pay | Admitting: Emergency Medicine

## 2014-03-26 DIAGNOSIS — M419 Scoliosis, unspecified: Secondary | ICD-10-CM | POA: Diagnosis not present

## 2014-03-26 DIAGNOSIS — F419 Anxiety disorder, unspecified: Secondary | ICD-10-CM | POA: Insufficient documentation

## 2014-03-26 DIAGNOSIS — Z79899 Other long term (current) drug therapy: Secondary | ICD-10-CM | POA: Diagnosis not present

## 2014-03-26 DIAGNOSIS — Z3202 Encounter for pregnancy test, result negative: Secondary | ICD-10-CM | POA: Diagnosis not present

## 2014-03-26 DIAGNOSIS — Z91040 Latex allergy status: Secondary | ICD-10-CM | POA: Diagnosis not present

## 2014-03-26 DIAGNOSIS — Z720 Tobacco use: Secondary | ICD-10-CM | POA: Insufficient documentation

## 2014-03-26 HISTORY — DX: Scoliosis, unspecified: M41.9

## 2014-03-26 LAB — I-STAT CHEM 8, ED
BUN: 8 mg/dL (ref 6–23)
Calcium, Ion: 1.22 mmol/L (ref 1.12–1.23)
Chloride: 104 mmol/L (ref 96–112)
Creatinine, Ser: 1 mg/dL (ref 0.50–1.10)
GLUCOSE: 88 mg/dL (ref 70–99)
HEMATOCRIT: 42 % (ref 36.0–46.0)
Hemoglobin: 14.3 g/dL (ref 12.0–15.0)
Potassium: 3.8 mmol/L (ref 3.5–5.1)
Sodium: 144 mmol/L (ref 135–145)
TCO2: 24 mmol/L (ref 0–100)

## 2014-03-26 LAB — POC URINE PREG, ED: Preg Test, Ur: NEGATIVE

## 2014-03-26 MED ORDER — LORAZEPAM 1 MG PO TABS
1.0000 mg | ORAL_TABLET | Freq: Once | ORAL | Status: AC
  Administered 2014-03-26: 1 mg via ORAL
  Filled 2014-03-26: qty 1

## 2014-03-26 NOTE — ED Notes (Addendum)
Per EMS pt comes from substance abuse facility, staff there didn't know pt.  Pt c/o anxiety, and her vision started to blur when they pulled in our parking lot. Vitals 130/90 Pt states that she having issues with her ex boyfriend, her landlord and [address]. Pt tearful.

## 2014-03-26 NOTE — Discharge Instructions (Signed)

## 2014-03-26 NOTE — ED Notes (Signed)
Neuro intact.

## 2014-03-26 NOTE — Progress Notes (Signed)
Confirmed pcp as [facility] EPIC updated

## 2014-03-26 NOTE — ED Provider Notes (Signed)
CSN: [id number]     Arrival date & time 03/26/14  1353 History   First MD Initiated Contact with Patient 03/26/14 1505     Chief Complaint  Patient presents with  . Anxiety     (Consider location/radiation/quality/duration/timing/severity/associated sxs/prior Treatment) Patient is a 29 y.o. female presenting with anxiety. The history is provided by the patient.  Anxiety This is a chronic problem. The current episode started 3 to 5 hours ago. The problem occurs hourly. The problem has been gradually improving. Pertinent negatives include no abdominal pain and no shortness of breath. Nothing aggravates the symptoms. Nothing relieves the symptoms. She has tried nothing for the symptoms.    Past Medical History  Diagnosis Date  . Schizophrenia   . Anxiety   . Scoliosis    Past Surgical History  Procedure Laterality Date  . Tubal ligation     Family History  Problem Relation Age of Onset  . Bone cancer Mother   . Anxiety disorder Father    History  Substance Use Topics  . Smoking status: Current Every Day Smoker -- 3.00 packs/day    Types: Cigarettes  . Smokeless tobacco: Never Used  . Alcohol Use: No   OB History    No data available     Review of Systems  Constitutional: Negative for fever.  Respiratory: Negative for cough and shortness of breath.   Gastrointestinal: Negative for vomiting and abdominal pain.  All other systems reviewed and are negative.     Allergies  Latex  Home Medications   Prior to Admission medications   Medication Sig Start Date End Date Taking? Authorizing Provider  ALPRAZolam (XANAX PO) Take 2 tablets by mouth 2 (two) times daily.   Yes Historical Provider, MD  docusate sodium (COLACE) 100 MG capsule Take 1 capsule (100 mg total) by mouth every 12 (twelve) hours. Patient taking differently: Take 100 mg by mouth 2 (two) times daily as needed for mild constipation.  12/01/13  Yes Oswaldo Conroy, PA-C  oxycodone (OXY-IR) 5 MG capsule Take  5 mg by mouth every 4 (four) hours as needed for pain.   Yes Historical Provider, MD   BP 121/76 mmHg  Pulse 72  Temp(Src) 97.6 F (36.4 C) (Oral)  Resp 18  SpO2 99% Physical Exam  Constitutional: She is oriented to person, place, and time. She appears well-developed and well-nourished. No distress.  HENT:  Head: Normocephalic and atraumatic.  Mouth/Throat: Oropharynx is clear and moist.  Eyes: EOM are normal. Pupils are equal, round, and reactive to light.  Neck: Normal range of motion. Neck supple.  Cardiovascular: Normal rate and regular rhythm.  Exam reveals no friction rub.   No murmur heard. Pulmonary/Chest: Effort normal and breath sounds normal. No respiratory distress. She has no wheezes. She has no rales.  Abdominal: Soft. She exhibits no distension. There is no tenderness. There is no rebound.  Musculoskeletal: Normal range of motion. She exhibits no edema.  Neurological: She is alert and oriented to person, place, and time. No cranial nerve deficit. She exhibits normal muscle tone. Coordination normal.  Skin: No rash noted. She is not diaphoretic.  Nursing note and vitals reviewed.   ED Course  Procedures (including critical care time) Labs Review Labs Reviewed  I-STAT CHEM 8, ED    Imaging Review No results found.   EKG Interpretation   Date/Time:  Tuesday March 26 2014 15:43:33 EST Ventricular Rate:  60 PR Interval:  153 QRS Duration: 89 QT Interval:  375 QTC Calculation:  375 R Axis:   67 Text Interpretation:  Sinus rhythm RSR' in V1 or V2, right VCD or RVH  Nonspecific T abnrm, anterolateral leads Baseline wander in lead(s) I III  aVL No old tracing to compare Confirmed by [facility]  MD, TREY ([id number]) on  03/26/2014 3:46:13 PM      MDM   Final diagnoses:  Anxiety    [age] show female here with anxiety attack. Came for mental health. Had 2 attacks a day, one lasting 5% seconds, then another one lasting an hour. Describes, "feeling like I'm going to die".  Denies any chest pain, shortness of breath, diarrhea, myalgias. He did describe some chest burning immediately preceding this that was persistently dated by some spicy foods. History of schizophrenia, anxiety, scoliosis, no cardiac history. Exam benign here. Will give Ativan by mouth, check EKG, chem 8. EKG is okay, normal QTC, no signs of Brugada or short PR interval. Feeling better after ativan. Stable for discharge. Instructed to f/u with Mental Health.    Leslie MochaBlair Preciliano Castell, MD 03/27/14 [phone]

## 2014-06-02 ENCOUNTER — Emergency Department (INDEPENDENT_AMBULATORY_CARE_PROVIDER_SITE_OTHER)
Admission: EM | Admit: 2014-06-02 | Discharge: 2014-06-02 | Disposition: A | Discharge status: Home / Self Care | Payer: Medicare Other | Source: Home / Self Care | Attending: Emergency Medicine | Admitting: Emergency Medicine

## 2014-06-02 ENCOUNTER — Encounter (HOSPITAL_COMMUNITY): Payer: Self-pay | Admitting: Emergency Medicine

## 2014-06-02 DIAGNOSIS — R1084 Generalized abdominal pain: Secondary | ICD-10-CM

## 2014-06-02 LAB — CBC WITH DIFFERENTIAL/PLATELET
BASOS PCT: 1 % (ref 0–1)
Basophils Absolute: 0 10*3/uL (ref 0.0–0.1)
Eosinophils Absolute: 0 10*3/uL (ref 0.0–0.7)
Eosinophils Relative: 1 % (ref 0–5)
HEMATOCRIT: 39.5 % (ref 36.0–46.0)
Hemoglobin: 12.7 g/dL (ref 12.0–15.0)
Lymphocytes Relative: 50 % — ABNORMAL HIGH (ref 12–46)
Lymphs Abs: 3.2 10*3/uL (ref 0.7–4.0)
MCH: 25.3 pg — AB (ref 26.0–34.0)
MCHC: 32.2 g/dL (ref 30.0–36.0)
MCV: 78.8 fL (ref 78.0–100.0)
Monocytes Absolute: 0.3 10*3/uL (ref 0.1–1.0)
Monocytes Relative: 5 % (ref 3–12)
Neutro Abs: 2.9 10*3/uL (ref 1.7–7.7)
Neutrophils Relative %: 45 % (ref 43–77)
Platelets: 236 10*3/uL (ref 150–400)
RBC: 5.01 MIL/uL (ref 3.87–5.11)
RDW: 16 % — ABNORMAL HIGH (ref 11.5–15.5)
WBC: 6.5 10*3/uL (ref 4.0–10.5)

## 2014-06-02 LAB — COMPREHENSIVE METABOLIC PANEL
ALBUMIN: 4.3 g/dL (ref 3.5–5.0)
ALT: 31 U/L (ref 14–54)
AST: 25 U/L (ref 15–41)
Alkaline Phosphatase: 49 U/L (ref 38–126)
Anion gap: 7 (ref 5–15)
BILIRUBIN TOTAL: 0.5 mg/dL (ref 0.3–1.2)
BUN: 11 mg/dL (ref 6–20)
CALCIUM: 9.7 mg/dL (ref 8.9–10.3)
CO2: 27 mmol/L (ref 22–32)
Chloride: 104 mmol/L (ref 101–111)
Creatinine, Ser: 0.9 mg/dL (ref 0.44–1.00)
GFR calc non Af Amer: >60 mL/min (ref >60)
Glucose, Bld: 85 mg/dL (ref 65–99)
Potassium: 4.2 mmol/L (ref 3.5–5.1)
Sodium: 138 mmol/L (ref 135–145)
TOTAL PROTEIN: 7.8 g/dL (ref 6.5–8.1)

## 2014-06-02 LAB — POCT PREGNANCY, URINE: PREG TEST UR: NEGATIVE

## 2014-06-02 LAB — POCT URINALYSIS DIP (DEVICE)
Bilirubin Urine: NEGATIVE
Glucose, UA: NEGATIVE mg/dL
Hgb urine dipstick: NEGATIVE
KETONES UR: NEGATIVE mg/dL
Leukocytes, UA: NEGATIVE
Nitrite: NEGATIVE
PH: 7 (ref 5.0–8.0)
PROTEIN: NEGATIVE mg/dL
SPECIFIC GRAVITY, URINE: 1.02 (ref 1.005–1.030)
Urobilinogen, UA: 0.2 mg/dL (ref 0.0–1.0)

## 2014-06-02 LAB — LIPASE, BLOOD: LIPASE: 31 U/L (ref 22–51)

## 2014-06-02 MED ORDER — ONDANSETRON HCL 4 MG PO TABS: 4.0000 mg | ORAL_TABLET | Freq: Three times a day (TID) | ORAL | Status: DC | PRN

## 2014-06-02 MED ORDER — HYDROCODONE-ACETAMINOPHEN 5-325 MG PO TABS
1.0000 | ORAL_TABLET | Freq: Once | ORAL | Status: AC
  Administered 2014-06-02: 1 via ORAL

## 2014-06-02 MED ORDER — HYDROCODONE-ACETAMINOPHEN 5-325 MG PO TABS
ORAL_TABLET | ORAL | Status: AC
  Filled 2014-06-02: qty 1

## 2014-06-02 MED ORDER — DICYCLOMINE HCL 20 MG PO TABS: 20.0000 mg | ORAL_TABLET | Freq: Three times a day (TID) | ORAL | Status: DC

## 2014-06-02 NOTE — ED Provider Notes (Signed)
CSN: [id number]     Arrival date & time 06/02/14  1229 History   First MD Initiated Contact with Patient 06/02/14 1340     Chief Complaint  Patient presents with  . Abdominal Pain   (Consider location/radiation/quality/duration/timing/severity/associated sxs/prior Treatment) HPI She is a 29 year old woman here for evaluation of abdominal pain. This is been going on for several weeks. It is been getting gradually worse. She describes it as starting in the upper abdomen and radiating around into the lower abdomen. She reports constant nausea. She has had some episodes of vomiting. She reports 3-4 loose stools daily. No blood in stool. No vaginal discharge or urinary symptoms. No fevers or chills. She is able to tolerate liquids and food, but her appetite is decreased. Eating seems to make the pain worse. She is a smoker. She denies alcohol use.  Past Medical History  Diagnosis Date  . Schizophrenia   . Anxiety   . Scoliosis    Past Surgical History  Procedure Laterality Date  . Tubal ligation     Family History  Problem Relation Age of Onset  . Bone cancer Mother   . Anxiety disorder Father    History  Substance Use Topics  . Smoking status: Current Every Day Smoker -- 3.00 packs/day    Types: Cigarettes  . Smokeless tobacco: Never Used  . Alcohol Use: No   OB History    No data available     Review of Systems As in history of present illness Allergies  Latex  Home Medications   Prior to Admission medications   Medication Sig Start Date End Date Taking? Authorizing Provider  ALPRAZolam (XANAX PO) Take 2 tablets by mouth 2 (two) times daily.    Historical Provider, MD  dicyclomine (BENTYL) 20 MG tablet Take 1 tablet (20 mg total) by mouth 4 (four) times daily -  before meals and at bedtime. 06/02/14   Charm RingsErin J Marvion Bastidas, MD  docusate sodium (COLACE) 100 MG capsule Take 1 capsule (100 mg total) by mouth every 12 (twelve) hours. Patient taking differently: Take 100 mg by mouth 2  (two) times daily as needed for mild constipation.  12/01/13   Oswaldo ConroyVictoria Creech, PA-C  ondansetron (ZOFRAN) 4 MG tablet Take 1 tablet (4 mg total) by mouth every 8 (eight) hours as needed for nausea or vomiting. 06/02/14   Charm RingsErin J Syeda Prickett, MD  oxycodone (OXY-IR) 5 MG capsule Take 5 mg by mouth every 4 (four) hours as needed for pain.    Historical Provider, MD   BP 100/60 mmHg  Pulse 60  Temp(Src) 98.4 F (36.9 C) (Oral)  Resp 14  SpO2 100%  LMP 04/02/2014 Physical Exam  Constitutional: She is oriented to person, place, and time. She appears well-developed and well-nourished. No distress.  Neck: Neck supple.  Cardiovascular: Normal rate, regular rhythm and normal heart sounds.   Pulmonary/Chest: Effort normal and breath sounds normal. No respiratory distress. She has no wheezes. She has no rales.  Abdominal: Soft. Bowel sounds are normal. She exhibits no distension. There is tenderness (in epigastric and right lower quadrant). There is no rebound and no guarding.  Neurological: She is alert and oriented to person, place, and time.    ED Course  Procedures (including critical care time) Labs Review Labs Reviewed  CBC WITH DIFFERENTIAL/PLATELET - Abnormal; Notable for the following:    MCH 25.3 (*)    RDW 16.0 (*)    Lymphocytes Relative 50 (*)    All other components within normal  limits  COMPREHENSIVE METABOLIC PANEL  LIPASE, BLOOD  POCT URINALYSIS DIP (DEVICE)  POCT PREGNANCY, URINE    Imaging Review No results found.   MDM   1. Generalized abdominal pain    Norco 5-325 milligrams by mouth given for pain.  Blood work is all normal. We'll treat symptomatically with Bentyl and Zofran. She will collect a stool sample for ova and parasite. Follow-up with PCP in one week.    Charm RingsErin J Purity Irmen, MD 06/02/14 [phone]

## 2014-06-02 NOTE — Discharge Instructions (Signed)
Your blood work is all normal. I do not have a good explanation for your pain. Take Bentyl 1 tablet 4 times a day for the next 2 weeks. Use the Zofran every 8 hours as needed for nausea. Please collect a stool sample and bring it back so we can test for parasites. Please follow-up with your primary care doctor in 1 week.

## 2014-06-02 NOTE — ED Notes (Signed)
Patient c/o abdominal pain x 2 weeks. Patient reports she has felt nauseous and has had urinary frequency. Patient is in NAD. She reports she has been taking Rx pain pills with no relief.

## 2014-07-11 ENCOUNTER — Emergency Department (HOSPITAL_COMMUNITY)
Admission: EM | Admit: 2014-07-11 | Discharge: 2014-07-11 | Disposition: A | Discharge status: Home / Self Care | Payer: Medicare Other

## 2014-07-11 ENCOUNTER — Encounter (HOSPITAL_COMMUNITY): Payer: Self-pay | Admitting: Emergency Medicine

## 2014-07-11 ENCOUNTER — Emergency Department (HOSPITAL_COMMUNITY)
Admission: EM | Admit: 2014-07-11 | Discharge: 2014-07-12 | Disposition: A | Discharge status: Home / Self Care | Payer: Medicare Other | Attending: Emergency Medicine | Admitting: Emergency Medicine

## 2014-07-11 DIAGNOSIS — F419 Anxiety disorder, unspecified: Secondary | ICD-10-CM | POA: Diagnosis not present

## 2014-07-11 DIAGNOSIS — Z792 Long term (current) use of antibiotics: Secondary | ICD-10-CM | POA: Insufficient documentation

## 2014-07-11 DIAGNOSIS — Z720 Tobacco use: Secondary | ICD-10-CM | POA: Insufficient documentation

## 2014-07-11 DIAGNOSIS — L0291 Cutaneous abscess, unspecified: Secondary | ICD-10-CM

## 2014-07-11 DIAGNOSIS — M419 Scoliosis, unspecified: Secondary | ICD-10-CM | POA: Diagnosis not present

## 2014-07-11 DIAGNOSIS — L02415 Cutaneous abscess of right lower limb: Secondary | ICD-10-CM | POA: Insufficient documentation

## 2014-07-11 DIAGNOSIS — Z91040 Latex allergy status: Secondary | ICD-10-CM | POA: Diagnosis not present

## 2014-07-11 DIAGNOSIS — Z79899 Other long term (current) drug therapy: Secondary | ICD-10-CM | POA: Insufficient documentation

## 2014-07-11 MED ORDER — HYDROCODONE-ACETAMINOPHEN 5-325 MG PO TABS: 1.0000 | ORAL_TABLET | Freq: Four times a day (QID) | ORAL | Status: DC | PRN

## 2014-07-11 MED ORDER — LIDOCAINE-EPINEPHRINE (PF) 2 %-1:200000 IJ SOLN
10.0000 mL | Freq: Once | INTRAMUSCULAR | Status: AC
  Administered 2014-07-11: 10 mL
  Filled 2014-07-11: qty 20

## 2014-07-11 MED ORDER — SULFAMETHOXAZOLE-TRIMETHOPRIM 800-160 MG PO TABS: 1.0000 | ORAL_TABLET | Freq: Two times a day (BID) | ORAL | Status: AC

## 2014-07-11 MED ORDER — SULFAMETHOXAZOLE-TRIMETHOPRIM 800-160 MG PO TABS: 1.0000 | ORAL_TABLET | Freq: Two times a day (BID) | ORAL | Status: DC

## 2014-07-11 NOTE — Discharge Instructions (Signed)
Cellulitis °Cellulitis is an infection of the skin and the tissue beneath it. The infected area is usually red and tender. Cellulitis occurs most often in the arms and lower legs.  °CAUSES  °Cellulitis is caused by bacteria that enter the skin through cracks or cuts in the skin. The most common types of bacteria that cause cellulitis are staphylococci and streptococci. °SIGNS AND SYMPTOMS  °· Redness and warmth. °· Swelling. °· Tenderness or pain. °· Fever. °DIAGNOSIS  °Your health care provider can usually determine what is wrong based on a physical exam. Blood tests may also be done. °TREATMENT  °Treatment usually involves taking an antibiotic medicine. °HOME CARE INSTRUCTIONS  °· Take your antibiotic medicine as directed by your health care provider. Finish the antibiotic even if you start to feel better. °· Keep the infected arm or leg elevated to reduce swelling. °· Apply a warm cloth to the affected area up to 4 times per day to relieve pain. °· Take medicines only as directed by your health care provider. °· Keep all follow-up visits as directed by your health care provider. °SEEK MEDICAL CARE IF:  °· You notice red streaks coming from the infected area. °· Your red area gets larger or turns dark in color. °· Your bone or joint underneath the infected area becomes painful after the skin has healed. °· Your infection returns in the same area or another area. °· You notice a swollen bump in the infected area. °· You develop new symptoms. °· You have a fever. °SEEK IMMEDIATE MEDICAL CARE IF:  °· You feel very sleepy. °· You develop vomiting or diarrhea. °· You have a general ill feeling (malaise) with muscle aches and pains. °MAKE SURE YOU:  °· Understand these instructions. °· Will watch your condition. °· Will get help right away if you are not doing well or get worse. °Document Released: 10/14/2004 Document Revised: 05/21/2013 Document Reviewed: 03/22/2011 °Exit[employer]® Patient Information ©2015 [employer].  This information is not intended to replace advice given to you by your health care provider. Make sure you discuss any questions you have with your health care provider. ° °Abscess °Care After °An abscess (also called a boil or furuncle) is an infected area that contains a collection of pus. Signs and symptoms of an abscess include pain, tenderness, redness, or hardness, or you may feel a moveable soft area under your skin. An abscess can occur anywhere in the body. The infection may spread to surrounding tissues causing cellulitis. A cut (incision) by the surgeon was made over your abscess and the pus was drained out. Gauze may have been packed into the space to provide a drain that will allow the cavity to heal from the inside outwards. The boil may be painful for 5 to 7 days. Most people with a boil do not have high fevers. Your abscess, if seen early, may not have localized, and may not have been lanced. If not, another appointment may be required for this if it does not get better on its own or with medications. °HOME CARE INSTRUCTIONS  °· Only take over-the-counter or prescription medicines for pain, discomfort, or fever as directed by your caregiver. °· When you bathe, soak and then remove gauze or iodoform packs at least daily or as directed by your caregiver. You may then wash the wound gently with mild soapy water. Repack with gauze or do as your caregiver directs. °SEEK IMMEDIATE MEDICAL CARE IF:  °· You develop increased pain, swelling, redness, drainage, or   bleeding in the wound site. °· You develop signs of generalized infection including muscle aches, chills, fever, or a general ill feeling. °· An oral temperature above 102° F (38.9° C) develops, not controlled by medication. °See your caregiver for a recheck if you develop any of the symptoms described above. If medications (antibiotics) were prescribed, take them as directed. °Document Released: 07/23/2004 Document Revised: 03/29/2011 Document  Reviewed: 03/20/2007 °[employer]® Patient Information ©2015 [employer]. This information is not intended to replace advice given to you by your health care provider. Make sure you discuss any questions you have with your health care provider. ° °

## 2014-07-11 NOTE — ED Notes (Signed)
Pt c/o abscess to right inner thigh x's 2 days.  No drainage noted

## 2014-07-11 NOTE — ED Notes (Signed)
Called for triage x4 

## 2014-07-11 NOTE — ED Notes (Signed)
Pt called x's 4 outside talking on phone

## 2014-07-12 NOTE — ED Provider Notes (Signed)
CSN: [id number]     Arrival date & time 07/11/14  2143 History   First MD Initiated Contact with Patient 07/11/14 2157     Chief Complaint  Patient presents with  . Abscess     (Consider location/radiation/quality/duration/timing/severity/associated sxs/prior Treatment) HPI Comments: Patient presents to the emergency department with chief complaint of right thigh abscess. She states that she noticed a small area of swelling and induration about 2 days ago. States that it is progressively worsened. It is painful to palpation. She states that it causes her pain when she walks and it rubs on her clothes. She denies any associated fevers, chills, nausea, or vomiting. She has not tried taking anything to alleviate the symptoms.  The history is provided by the patient. No language interpreter was used.    Past Medical History  Diagnosis Date  . Schizophrenia   . Anxiety   . Scoliosis    Past Surgical History  Procedure Laterality Date  . Tubal ligation     Family History  Problem Relation Age of Onset  . Bone cancer Mother   . Anxiety disorder Father    History  Substance Use Topics  . Smoking status: Current Every Day Smoker -- 3.00 packs/day    Types: Cigarettes  . Smokeless tobacco: Never Used  . Alcohol Use: No   OB History    No data available     Review of Systems  Constitutional: Negative for fever and chills.  Respiratory: Negative for shortness of breath.   Cardiovascular: Negative for chest pain.  Gastrointestinal: Negative for nausea, vomiting, diarrhea and constipation.  Genitourinary: Negative for dysuria.  Skin: Positive for color change.      Allergies  Latex  Home Medications   Prior to Admission medications   Medication Sig Start Date End Date Taking? Authorizing Provider  ALPRAZolam (XANAX PO) Take 2 tablets by mouth 2 (two) times daily.    Historical Provider, MD  dicyclomine (BENTYL) 20 MG tablet Take 1 tablet (20 mg total) by mouth 4 (four)  times daily -  before meals and at bedtime. 06/02/14   Charm Rings, MD  docusate sodium (COLACE) 100 MG capsule Take 1 capsule (100 mg total) by mouth every 12 (twelve) hours. Patient taking differently: Take 100 mg by mouth 2 (two) times daily as needed for mild constipation.  12/01/13   Oswaldo Conroy, PA-C  HYDROcodone-acetaminophen (NORCO/VICODIN) 5-325 MG per tablet Take 1-2 tablets by mouth every 6 (six) hours as needed. 07/11/14   Roxy Horseman, PA-C  ondansetron (ZOFRAN) 4 MG tablet Take 1 tablet (4 mg total) by mouth every 8 (eight) hours as needed for nausea or vomiting. 06/02/14   Charm Rings, MD  oxycodone (OXY-IR) 5 MG capsule Take 5 mg by mouth every 4 (four) hours as needed for pain.    Historical Provider, MD  sulfamethoxazole-trimethoprim (BACTRIM DS,SEPTRA DS) 800-160 MG per tablet Take 1 tablet by mouth 2 (two) times daily. 07/11/14 07/18/14  Roxy Horseman, PA-C   BP 109/64 mmHg  Pulse 73  Temp(Src) 98.6 F (37 C) (Oral)  Resp 18  Ht 5' (1.524 m)  Wt 103 lb (46.72 kg)  BMI 20.12 kg/m2  SpO2 100%  LMP 07/04/2014 Physical Exam  Constitutional: She is oriented to person, place, and time. She appears well-developed and well-nourished.  HENT:  Head: Normocephalic and atraumatic.  Eyes: Conjunctivae and EOM are normal.  Neck: Normal range of motion.  Cardiovascular: Normal rate.   Pulmonary/Chest: Effort normal.  Abdominal: She  exhibits no distension.  Musculoskeletal: Normal range of motion.  Neurological: She is alert and oriented to person, place, and time.  Skin: Skin is dry.  2 x 2 centimeter abscess to the right thigh with mild surrounding cellulitis  Psychiatric: She has a normal mood and affect. Her behavior is normal. Judgment and thought content normal.  Nursing note and vitals reviewed.   ED Course  Procedures (including critical care time) Labs Review Labs Reviewed - No data to display  Imaging Review No results found.   EKG Interpretation None       MDM   Final diagnoses:  Abscess    Patient with skin abscess amenable to incision and drainage.  Abscess was not large enough to warrant packing or drain,  wound recheck in 2 days. Encouraged home warm soaks and flushing.  Mild signs of cellulitis is surrounding skin.  Will d/c to home.      Roxy Horseman, PA-C 07/12/14 0006  Linwood Dibbles, MD 07/12/14 [phone]

## 2015-04-07 ENCOUNTER — Encounter (HOSPITAL_COMMUNITY): Payer: Self-pay | Admitting: Emergency Medicine

## 2015-04-07 ENCOUNTER — Emergency Department (HOSPITAL_COMMUNITY)
Admission: EM | Admit: 2015-04-07 | Discharge: 2015-04-07 | Disposition: A | Discharge status: Home / Self Care | Payer: Medicare Other | Attending: Emergency Medicine | Admitting: Emergency Medicine

## 2015-04-07 DIAGNOSIS — Y998 Other external cause status: Secondary | ICD-10-CM | POA: Insufficient documentation

## 2015-04-07 DIAGNOSIS — Y9289 Other specified places as the place of occurrence of the external cause: Secondary | ICD-10-CM | POA: Diagnosis not present

## 2015-04-07 DIAGNOSIS — S3991XA Unspecified injury of abdomen, initial encounter: Secondary | ICD-10-CM | POA: Insufficient documentation

## 2015-04-07 DIAGNOSIS — Y9389 Activity, other specified: Secondary | ICD-10-CM | POA: Insufficient documentation

## 2015-04-07 DIAGNOSIS — F17210 Nicotine dependence, cigarettes, uncomplicated: Secondary | ICD-10-CM | POA: Diagnosis not present

## 2015-04-07 LAB — I-STAT BETA HCG BLOOD, ED (MC, WL, AP ONLY)

## 2015-04-07 LAB — COMPREHENSIVE METABOLIC PANEL
ALBUMIN: 4.3 g/dL (ref 3.5–5.0)
ALK PHOS: 43 U/L (ref 38–126)
ALT: 23 U/L (ref 14–54)
AST: 32 U/L (ref 15–41)
Anion gap: 8 (ref 5–15)
BILIRUBIN TOTAL: 0.7 mg/dL (ref 0.3–1.2)
BUN: 11 mg/dL (ref 6–20)
CALCIUM: 10.1 mg/dL (ref 8.9–10.3)
CO2: 26 mmol/L (ref 22–32)
CREATININE: 1.13 mg/dL — AB (ref 0.44–1.00)
Chloride: 107 mmol/L (ref 101–111)
GFR calc Af Amer: >60 mL/min (ref >60)
GFR calc non Af Amer: >60 mL/min (ref >60)
GLUCOSE: 93 mg/dL (ref 65–99)
Potassium: 4.4 mmol/L (ref 3.5–5.1)
SODIUM: 141 mmol/L (ref 135–145)
TOTAL PROTEIN: 7.5 g/dL (ref 6.5–8.1)

## 2015-04-07 LAB — CBC
HCT: 37.6 % (ref 36.0–46.0)
HEMOGLOBIN: 12.5 g/dL (ref 12.0–15.0)
MCH: 26.3 pg (ref 26.0–34.0)
MCHC: 33.2 g/dL (ref 30.0–36.0)
MCV: 79 fL (ref 78.0–100.0)
PLATELETS: 231 10*3/uL (ref 150–400)
RBC: 4.76 MIL/uL (ref 3.87–5.11)
RDW: 16.5 % — AB (ref 11.5–15.5)
WBC: 5.3 10*3/uL (ref 4.0–10.5)

## 2015-04-07 LAB — LIPASE, BLOOD: Lipase: 28 U/L (ref 11–51)
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
## 2015-06-01 ENCOUNTER — Telehealth: Payer: Self-pay
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
## 2015-06-05 DIAGNOSIS — F411 Generalized anxiety disorder: Secondary | ICD-10-CM

## 2015-06-23 ENCOUNTER — Emergency Department (HOSPITAL_COMMUNITY)
Admission: EM | Admit: 2015-06-23 | Discharge: 2015-06-23 | Disposition: A | Discharge status: Home / Self Care | Payer: Medicare Other | Attending: Emergency Medicine | Admitting: Emergency Medicine

## 2015-06-23 DIAGNOSIS — Z8739 Personal history of other diseases of the musculoskeletal system and connective tissue: Secondary | ICD-10-CM | POA: Insufficient documentation

## 2015-06-23 DIAGNOSIS — Z792 Long term (current) use of antibiotics: Secondary | ICD-10-CM | POA: Insufficient documentation

## 2015-06-23 DIAGNOSIS — F17210 Nicotine dependence, cigarettes, uncomplicated: Secondary | ICD-10-CM | POA: Diagnosis not present

## 2015-06-23 DIAGNOSIS — N898 Other specified noninflammatory disorders of vagina: Secondary | ICD-10-CM

## 2015-06-23 DIAGNOSIS — Z91040 Latex allergy status: Secondary | ICD-10-CM | POA: Insufficient documentation

## 2015-06-23 DIAGNOSIS — N760 Acute vaginitis: Secondary | ICD-10-CM | POA: Diagnosis not present

## 2015-06-23 DIAGNOSIS — Z8659 Personal history of other mental and behavioral disorders: Secondary | ICD-10-CM | POA: Diagnosis not present

## 2015-06-23 DIAGNOSIS — B9689 Other specified bacterial agents as the cause of diseases classified elsewhere: Secondary | ICD-10-CM

## 2015-06-23 DIAGNOSIS — Z3202 Encounter for pregnancy test, result negative: Secondary | ICD-10-CM | POA: Diagnosis not present

## 2015-06-23 DIAGNOSIS — N912 Amenorrhea, unspecified: Secondary | ICD-10-CM | POA: Insufficient documentation

## 2015-06-23 LAB — CBC WITH DIFFERENTIAL/PLATELET
BASOS ABS: 0 10*3/uL (ref 0.0–0.1)
Basophils Relative: 0 %
Eosinophils Absolute: 0 10*3/uL (ref 0.0–0.7)
Eosinophils Relative: 1 %
HEMATOCRIT: 37.6 % (ref 36.0–46.0)
HEMOGLOBIN: 12 g/dL (ref 12.0–15.0)
LYMPHS ABS: 2 10*3/uL (ref 0.7–4.0)
LYMPHS PCT: 32 %
MCH: 25.2 pg — AB (ref 26.0–34.0)
MCHC: 31.9 g/dL (ref 30.0–36.0)
MCV: 79 fL (ref 78.0–100.0)
Monocytes Absolute: 0.3 10*3/uL (ref 0.1–1.0)
Monocytes Relative: 5 %
NEUTROS ABS: 3.8 10*3/uL (ref 1.7–7.7)
NEUTROS PCT: 62 %
Platelets: 198 10*3/uL (ref 150–400)
RBC: 4.76 MIL/uL (ref 3.87–5.11)
RDW: 14.8 % (ref 11.5–15.5)
WBC: 6.1 10*3/uL (ref 4.0–10.5)

## 2015-06-23 LAB — URINALYSIS, ROUTINE W REFLEX MICROSCOPIC
Bilirubin Urine: NEGATIVE
GLUCOSE, UA: NEGATIVE mg/dL
HGB URINE DIPSTICK: NEGATIVE
Ketones, ur: NEGATIVE mg/dL
LEUKOCYTES UA: NEGATIVE
Nitrite: NEGATIVE
Protein, ur: NEGATIVE mg/dL
SPECIFIC GRAVITY, URINE: 1.014 (ref 1.005–1.030)
pH: 6.5 (ref 5.0–8.0)

## 2015-06-23 LAB — TYPE AND SCREEN
ABO/RH(D): O POS
ANTIBODY SCREEN: NEGATIVE

## 2015-06-23 LAB — BASIC METABOLIC PANEL
ANION GAP: 5 (ref 5–15)
BUN: 11 mg/dL (ref 6–20)
CHLORIDE: 108 mmol/L (ref 101–111)
CO2: 26 mmol/L (ref 22–32)
Calcium: 9.7 mg/dL (ref 8.9–10.3)
Creatinine, Ser: 0.97 mg/dL (ref 0.44–1.00)
GFR calc Af Amer: >60 mL/min (ref >60)
GFR calc non Af Amer: >60 mL/min (ref >60)
GLUCOSE: 95 mg/dL (ref 65–99)
POTASSIUM: 4.2 mmol/L (ref 3.5–5.1)
SODIUM: 139 mmol/L (ref 135–145)

## 2015-06-23 LAB — WET PREP, GENITAL
Sperm: NONE SEEN
Trich, Wet Prep: NONE SEEN
Yeast Wet Prep HPF POC: NONE SEEN

## 2015-06-23 LAB — I-STAT BETA HCG BLOOD, ED (MC, WL, AP ONLY)

## 2015-06-23 LAB — ABO/RH: ABO/RH(D): O POS

## 2015-06-23 MED ORDER — ONDANSETRON 4 MG PO TBDP
8.0000 mg | ORAL_TABLET | Freq: Once | ORAL | Status: AC
  Administered 2015-06-23: 8 mg via ORAL
  Filled 2015-06-23: qty 2

## 2015-06-23 MED ORDER — METRONIDAZOLE 500 MG PO TABS: 500.0000 mg | ORAL_TABLET | Freq: Two times a day (BID) | ORAL | Status: AC

## 2015-06-23 NOTE — ED Provider Notes (Signed)
CSN: [id number]     Arrival date & time 06/23/15  0911 History   First MD Initiated Contact with Patient 06/23/15 [phone]     Chief Complaint  Patient presents with  . Possible Pregnancy     (Consider location/radiation/quality/duration/timing/severity/associated sxs/prior Treatment) HPI Leslie Jarvis is a 30 y.o. female history of schizophrenia comes in for evaluation of possible pregnancy. Patient reports she may be pregnant as she has not had her period in 3 months. She reports her last normal period was March 17. She reports vaginal discharge, but no unusual vaginal bleeding. She reports lower abdominal discomfort that has been persistent over the past 2 weeks. She reports intermittent nausea and vomiting, nonbloody and nonbilious. She also reports intermittent diarrhea, no constipation. She has one living son, no complications of pregnancy. No history of ectopic. Has not followed up for this problem. No other modifying factors.  Past Medical History  Diagnosis Date  . Schizophrenia (HCC)   . Anxiety   . Scoliosis    Past Surgical History  Procedure Laterality Date  . Tubal ligation     Family History  Problem Relation Age of Onset  . Bone cancer Mother   . Anxiety disorder Father    Social History  Substance Use Topics  . Smoking status: Current Every Day Smoker -- 3.00 packs/day    Types: Cigarettes  . Smokeless tobacco: Never Used  . Alcohol Use: No   OB History    No data available     Review of Systems A 10 point review of systems was completed and was negative except for pertinent positives and negatives as mentioned in the history of present illness     Allergies  Latex  Home Medications   Prior to Admission medications   Medication Sig Start Date End Date Taking? Authorizing Provider  metroNIDAZOLE (FLAGYL) 500 MG tablet Take 1 tablet (500 mg total) by mouth 2 (two) times daily. 06/23/15   Joycie Peek, PA-C   BP 109/75 mmHg  Pulse 61  Temp(Src)  98.1 F (36.7 C) (Oral)  Resp 16  SpO2 100%  LMP 04/04/2015 (Approximate) Physical Exam  Constitutional: She is oriented to person, place, and time. She appears well-developed and well-nourished.  HENT:  Head: Normocephalic and atraumatic.  Mouth/Throat: Oropharynx is clear and moist.  Eyes: Conjunctivae are normal. Pupils are equal, round, and reactive to light. Right eye exhibits no discharge. Left eye exhibits no discharge. No scleral icterus.  Neck: Neck supple.  Cardiovascular: Normal rate, regular rhythm and normal heart sounds.   Pulmonary/Chest: Effort normal and breath sounds normal. No respiratory distress. She has no wheezes. She has no rales.  Abdominal: Soft.  Diffuse lower abdominal tenderness to palpation. No abdominal distention. No rebound or guarding. No peritoneal signs.  Genitourinary:  Chaperone was present for the entire genital exam. No lesions or rashes appreciated on vulva. Cervix visualized on speculum exam and appropriate cultures sampled. Scant blood in vaginal vault. Discharge: Scant white discharge. Upon bi manual exam- No TTP of the adnexa, no cervical motion tenderness. No fullness or masses appreciated. No abnormalities appreciated in structural anatomy.   Musculoskeletal: She exhibits no tenderness.  Neurological: She is alert and oriented to person, place, and time.  Cranial Nerves II-XII grossly intact  Skin: Skin is warm and dry. No rash noted.  Psychiatric: She has a normal mood and affect.  Nursing note and vitals reviewed.   ED Course  Procedures (including critical care time) Labs Review Labs Reviewed  WET PREP, GENITAL - Abnormal; Notable for the following:    Clue Cells Wet Prep HPF POC PRESENT (*)    WBC, Wet Prep HPF POC MODERATE (*)    All other components within normal limits  CBC WITH DIFFERENTIAL/PLATELET - Abnormal; Notable for the following:    MCH 25.2 (*)    All other components within normal limits  URINALYSIS, ROUTINE W  REFLEX MICROSCOPIC (NOT AT [facility])  BASIC METABOLIC PANEL  RPR  HIV ANTIBODY (ROUTINE TESTING)  I-STAT BETA HCG BLOOD, ED ([facility], [facility], [facility] ONLY)  TYPE AND SCREEN  ABO/RH  GC/CHLAMYDIA PROBE AMP ([facility]) NOT AT [facility]    Imaging Review No results found. I have personally reviewed and evaluated these images and lab results as part of my medical decision-making.   EKG Interpretation None     Meds given in ED:  Medications  ondansetron (ZOFRAN-ODT) disintegrating tablet 8 mg (8 mg Oral Given 06/23/15 1117)    Discharge Medication List as of 06/23/2015 12:24 PM    START taking these medications   Details  metroNIDAZOLE (FLAGYL) 500 MG tablet Take 1 tablet (500 mg total) by mouth 2 (two) times daily., Starting 06/23/2015, Until Discontinued, Print       Filed Vitals:   06/23/15 1001 06/23/15 1030 06/23/15 1130 06/23/15 1227  BP: 116/78 112/76 109/75   Pulse: 65 74 61   Temp:    98.1 F (36.7 C)  TempSrc:    Oral  Resp:    16  SpO2: 100% 100% 100%     MDM  Patient with concern for possible pregnancy as she has not had her period over the past 3 months. Mild lower abdominal tenderness. No peritoneal signs or evidence of surgical abdomen. On arrival, she is hemodynamically stable and afebrile. She overall appears well and nontoxic. Pregnancy test is negative. She has pending cultures for STI, but denies any exposure to STI and prefers to wait for culture results. She understands that she will need to return for treatment if cultures are positive. Treated for BV. Screening labs are otherwise unremarkable, no evidence of urinary infection. Discussed follow-up with her PCP in 2 days for reevaluation. She verbalizes understanding and agrees with this plan and subsequent discharge. Final diagnoses:  Vaginal discharge  BV (bacterial vaginosis)  Amenorrhea        Joycie PeekBenjamin Jomarie Gellis, PA-C 06/23/15 1316  Lyndal Pulleyaniel Knott, MD 06/24/15 [phone]

## 2015-06-23 NOTE — ED Notes (Addendum)
Needs to confirm preg G1 P 1 A 0 L1 also having abd pain denies dysuria but does hav vag d/c

## 2015-06-23 NOTE — ED Notes (Signed)
Patient instructed to undress from the waist down for the procedure

## 2015-06-23 NOTE — Discharge Instructions (Signed)
Your pregnancy test was negative. You did have a positive result for bacterial vaginosis and will be treated for this with antibiotics. You have pending tests for STDs. If these are positive you need to return for treatment and inform all sexual partners so they may be treated also. Return to ED sooner for any worsening symptoms.  Bacterial Vaginosis Bacterial vaginosis is an infection of the vagina. It happens when too many germs (bacteria) grow in the vagina. Having this infection puts you at risk for getting other infections from sex. Treating this infection can help lower your risk for other infections, such as:   Chlamydia.  Gonorrhea.  HIV.  Herpes. HOME CARE  Take your medicine as told by your doctor.  Finish your medicine even if you start to feel better.  Tell your sex partner that you have an infection. They should see their doctor for treatment.  During treatment:  Avoid sex or use condoms correctly.  Do not douche.  Do not drink alcohol unless your doctor tells you it is ok.  Do not breastfeed unless your doctor tells you it is ok. GET HELP IF:  You are not getting better after 3 days of treatment.  You have more grey fluid (discharge) coming from your vagina than before.  You have more pain than before.  You have a fever. MAKE SURE YOU:   Understand these instructions.  Will watch your condition.  Will get help right away if you are not doing well or get worse.   This information is not intended to replace advice given to you by your health care provider. Make sure you discuss any questions you have with your health care provider.   Document Released: 10/14/2007 Document Revised: 01/25/2014 Document Reviewed: 08/16/2012 Elsevier Interactive Patient Education 2016 [employer]  Menstruation Menstruation is the monthly passing of blood, tissue, fluid, and mucus. It is also known as a period. Your body is shedding the lining of the uterus. The flow of  blood usually occurs during 3-7 consecutive days each month. Hormones control the menstrual cycle. Hormones are a chemical substance produced by endocrine glands in the body to regulate different bodily functions. The first menstrual period may start any time between age 45 years to 16 years. However, it usually starts around age 28 years. Some girls have regular monthly menstrual cycles right from the beginning. However, it is not unusual to have only a couple of drops of blood or spotting when you first start menstruating. It is also not unusual to have two periods a month or miss a month or two when first starting your periods. SYMPTOMS   Mild to moderate abdominal cramps.  Aching or pain in the lower back area. Symptoms may occur 5-10 days before your menstrual period starts. These symptoms are referred to as premenstrual syndrome (PMS). These symptoms can include:  Headache.  Breast tenderness and swelling.  Bloating.  Tiredness (fatigue).  Mood changes.  Craving for certain foods. These are normal signs and symptoms and can vary in severity. To help relieve these problems, ask your caregiver if you can take over-the-counter medications for pain or discomfort. If the symptoms are not controllable, see your caregiver for help.  HORMONES INVOLVED IN MENSTRUATION Menstruation comes about because of hormones produced by the pituitary gland in the brain and the ovaries that affect the uterine lining. First, the pituitary gland in the brain produces the hormone follicle stimulating hormone [facility](FSH). FSH stimulates the ovaries to produce estrogen, which thickens the  uterine lining and begins to develop an egg in the ovary. About 14 days later, the pituitary gland produces another hormone called luteinizing hormone (LH). LH causes the egg to come out of a sac in the ovary (ovulation). The empty sac on the ovary called the corpus luteum is stimulated by another hormone from the pituitary gland called  luteotropin. The corpus luteum begins to produce the estrogen and progesterone hormone. The progesterone hormone prepares the lining of the uterus to have the fertilized egg (egg combined with sperm) attach to the lining of the uterus and begin to develop into a fetus. If the egg is not fertilized, the corpus luteum stops producing estrogen and progesterone, it disappears, the lining of the uterus sloughs off and a menstrual period begins. Then the menstrual cycle starts all over again and will continue monthly unless pregnancy occurs or menopause begins. The secretion of hormones is complex. Various parts of the body become involved in many chemical activities. Female sex hormones have other functions in a woman's body as well. Estrogen increases a woman's sex drive (libido). It naturally helps body get rid of fluids (diuretic). It also aids in the process of building new bone. Therefore, maintaining hormonal health is essential to all levels of a woman's well being. These hormones are usually present in normal amounts and cause you to menstruate. It is the relationship between the (small) levels of the hormones that is critical. When the balance is upset, menstrual irregularities can occur. HOW DOES THE MENSTRUAL CYCLE HAPPEN?  Menstrual cycles vary in length from 21-35 days with an average of 29 days. The cycle begins on the first day of bleeding. At this time, the pituitary gland in the brain releases FSH that travels through the bloodstream to the ovaries. The [facility] stimulates the follicles in the ovaries. This prepares the body for ovulation that occurs around the 14th day of the cycle. The ovaries produce estrogen, and this makes sure conditions are right in the uterus for implantation of the fertilized egg.  When the levels of estrogen reach a high enough level, it signals the gland in the brain (pituitary gland) to release a surge of LH. This causes the release of the ripest egg from its follicle  (ovulation). Usually only one follicle releases one egg, but sometimes more than one follicle releases an egg especially when stimulating the ovaries for in vitro fertilization. The egg can then be collected by either fallopian tube to await fertilization. The burst follicle within the ovary that is left behind is now called the corpus luteum or "yellow body." The corpus luteum continues to give off (secrete) reduced amounts of estrogen. This closes and hardens the cervix. It dries up the mucus to the naturally infertile condition.  The corpus luteum also begins to give off greater amounts of progesterone. This causes the lining of the uterus (endometrium) to thicken even more in preparation for the fertilized egg. The egg is starting to journey down from the fallopian tube to the uterus. It also signals the ovaries to stop releasing eggs. It assists in returning the cervical mucus to its infertile state.  If the egg implants successfully into the womb lining and pregnancy occurs, progesterone levels will continue to raise. It is often this hormone that gives some pregnant women a feeling of well being, like a "natural high." Progesterone levels drop again after childbirth.  If fertilization does not occur, the corpus luteum dies, stopping the production of hormones. This sudden drop in progesterone  causes the uterine lining to break down, accompanied by blood (menstruation).  This starts the cycle back at day 1. The whole process starts all over again. Woman go through this cycle every month from puberty to menopause. Women have breaks only for pregnancy and breastfeeding (lactation), unless the woman has health problems that affect the female hormone system or chooses to use oral contraceptives to have unnatural menstrual periods. HOME CARE INSTRUCTIONS   Keep track of your periods by using a calendar.  If you use tampons, get the least absorbent to avoid toxic shock syndrome.  Do not leave tampons  in the vagina over night or longer than 6 hours.  Wear a sanitary pad over night.  Exercise 3-5 times a week or more.  Avoid foods and drinks that you know will make your symptoms worse before or during your period. SEEK MEDICAL CARE IF:   You develop a fever with your period.  Your periods are lasting more than 7 days.  Your period is so heavy that you have to change pads or tampons every 30 minutes.  You develop clots with your period and never had clots before.  You cannot get relief from over-the-counter medication for your symptoms.  Your period has not started, and it has been longer than 35 days.   This information is not intended to replace advice given to you by your health care provider. Make sure you discuss any questions you have with your health care provider.   Document Released: 12/25/2001 Document Revised: 09/25/2014 Document Reviewed: 08/03/2012 Elsevier Interactive Patient Education [employer]

## 2015-06-24 LAB — HIV ANTIBODY (ROUTINE TESTING W REFLEX): HIV SCREEN 4TH GENERATION: NONREACTIVE

## 2015-06-24 LAB — RPR: RPR: NONREACTIVE

## 2015-06-24 LAB — GC/CHLAMYDIA PROBE AMP (~~LOC~~) NOT AT ARMC
CHLAMYDIA, DNA PROBE: NEGATIVE
Neisseria Gonorrhea: NEGATIVE

## 2015-06-28 DIAGNOSIS — F411 Generalized anxiety disorder: Secondary | ICD-10-CM

## 2015-06-28 NOTE — Congregational Nurse Program (Signed)
Congregational Nurse Program Note  Date of Encounter: 06/28/2015  Past Medical History: Past Medical History  Diagnosis Date  . Schizophrenia (HCC)   . Anxiety   . Scoliosis     Encounter Details:     CNP Questionnaire - 06/28/15 0100    Patient Demographics   Is this a new or existing patient? New   Patient is considered a/an Not Applicable   Race African-American/Black   Patient Assistance   Location of Patient [facility]   Patient's financial/insurance status Low Income   Uninsured Patient No   Patient referred to apply for the following financial assistance Medicaid   Food insecurities addressed Not Applicable   Transportation assistance No   Assistance securing medications No   Educational health offerings Interpersonal relationships   Encounter Details   Primary purpose of visit Spiritual Care/Support Visit   Was an Emergency Department visit averted? No   Does patient have a medical provider? Yes   Patient referred to Not Applicable   Was a mental health screening completed? (GAINS tool) No   Does patient have dental issues? No   Does patient have vision issues? No   Does your patient have an abnormal blood pressure today? No   Since previous encounter, have you referred patient for abnormal blood pressure that resulted in a new diagnosis or medication change? No   Does your patient have an abnormal blood glucose today? No   Since previous encounter, have you referred patient for abnormal blood glucose that resulted in a new diagnosis or medication change? No   Was there a life-saving intervention made? No

## 2015-07-18 DIAGNOSIS — Z719 Counseling, unspecified: Secondary | ICD-10-CM
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
## 2015-07-25 NOTE — Congregational Nurse Program (Signed)
Congregational Nurse Program Note  Date of Encounter: 07/18/2015  Past Medical History: Past Medical History  Diagnosis Date  . Schizophrenia (HCC)   . Anxiety   . Scoliosis     Encounter Details:     CNP Questionnaire - 07/18/15 0056    Patient Demographics   Is this a new or existing patient? Existing   Patient is considered a/an Not Applicable   Race African-American/Black   Patient Assistance   Location of Patient [facility]   Patient's financial/insurance status Low Income   Uninsured Patient No   Patient referred to apply for the following financial assistance Medicaid   Food insecurities addressed Not Applicable   Transportation assistance No   Assistance securing medications No   Educational health offerings Interpersonal relationships;Spiritual care   Encounter Details   Primary purpose of visit Spiritual Care/Support Visit   Was an Emergency Department visit averted? No   Does patient have a medical provider? Yes   Patient referred to Not Applicable   Was a mental health screening completed? (GAINS tool) No   Does patient have dental issues? No   Does patient have vision issues? No   Does your patient have an abnormal blood pressure today? No   Since previous encounter, have you referred patient for abnormal blood pressure that resulted in a new diagnosis or medication change? No   Does your patient have an abnormal blood glucose today? No   Since previous encounter, have you referred patient for abnormal blood glucose that resulted in a new diagnosis or medication change? No   Was there a life-saving intervention made? No       Client graduated from steps to success today. .  She made a speech to support the class and note what they had accomplished.Client will followup with me  Client was expecting mother to day, but said mother was in the hospital due to cancer.  She did not elaborate.  Reinterated need for her to remain calm and keep  goal of [facility].

## 2015-08-12 ENCOUNTER — Encounter (HOSPITAL_COMMUNITY): Payer: Self-pay | Admitting: Emergency Medicine

## 2015-08-12 ENCOUNTER — Emergency Department (HOSPITAL_COMMUNITY)
Admission: EM | Admit: 2015-08-12 | Discharge: 2015-08-12 | Disposition: A | Discharge status: Home / Self Care | Payer: Medicare Other | Attending: Emergency Medicine | Admitting: Emergency Medicine

## 2015-08-12 ENCOUNTER — Emergency Department (HOSPITAL_COMMUNITY): Payer: Medicare Other

## 2015-08-12 DIAGNOSIS — R5383 Other fatigue: Secondary | ICD-10-CM | POA: Diagnosis not present

## 2015-08-12 DIAGNOSIS — F17210 Nicotine dependence, cigarettes, uncomplicated: Secondary | ICD-10-CM | POA: Diagnosis not present

## 2015-08-12 DIAGNOSIS — Z794 Long term (current) use of insulin: Secondary | ICD-10-CM | POA: Insufficient documentation

## 2015-08-12 DIAGNOSIS — R0789 Other chest pain: Secondary | ICD-10-CM | POA: Diagnosis present

## 2015-08-12 DIAGNOSIS — F209 Schizophrenia, unspecified: Secondary | ICD-10-CM | POA: Diagnosis not present

## 2015-08-12 DIAGNOSIS — F410 Panic disorder [episodic paroxysmal anxiety] without agoraphobia: Secondary | ICD-10-CM | POA: Diagnosis not present

## 2015-08-12 DIAGNOSIS — R112 Nausea with vomiting, unspecified: Secondary | ICD-10-CM | POA: Insufficient documentation

## 2015-08-12 DIAGNOSIS — E109 Type 1 diabetes mellitus without complications: Secondary | ICD-10-CM | POA: Diagnosis not present

## 2015-08-12 LAB — BASIC METABOLIC PANEL
Anion gap: 5 (ref 5–15)
BUN: 10 mg/dL (ref 6–20)
CO2: 23 mmol/L (ref 22–32)
CREATININE: 1.01 mg/dL — AB (ref 0.44–1.00)
Calcium: 9.5 mg/dL (ref 8.9–10.3)
Chloride: 111 mmol/L (ref 101–111)
GFR calc Af Amer: >60 mL/min (ref >60)
Glucose, Bld: 84 mg/dL (ref 65–99)
POTASSIUM: 3.7 mmol/L (ref 3.5–5.1)
SODIUM: 139 mmol/L (ref 135–145)

## 2015-08-12 LAB — CBC
HCT: 36.3 % (ref 36.0–46.0)
Hemoglobin: 12 g/dL (ref 12.0–15.0)
MCH: 26.1 pg (ref 26.0–34.0)
MCHC: 33.1 g/dL (ref 30.0–36.0)
MCV: 78.9 fL (ref 78.0–100.0)
PLATELETS: 162 10*3/uL (ref 150–400)
RBC: 4.6 MIL/uL (ref 3.87–5.11)
RDW: 15 % (ref 11.5–15.5)
WBC: 6.8 10*3/uL (ref 4.0–10.5)

## 2015-08-12 LAB — I-STAT TROPONIN, ED: TROPONIN I, POC: 0 ng/mL (ref 0.00–0.08)

## 2015-08-12 LAB — PREGNANCY, URINE: PREG TEST UR: NEGATIVE

## 2015-08-12 MED ORDER — SODIUM CHLORIDE 0.9 % IV BOLUS (SEPSIS)
1000.0000 mL | Freq: Once | INTRAVENOUS | Status: AC
  Administered 2015-08-12: 1000 mL via INTRAVENOUS

## 2015-08-12 NOTE — ED Notes (Signed)
Patient not in room. Unable to assess 

## 2015-08-12 NOTE — ED Notes (Signed)
Patient transported to X-ray 

## 2015-08-12 NOTE — ED Triage Notes (Signed)
Per EMS, pt picked up at [facility].  Pt went lethargic according to [facility].  When EMS arrived, pt is weak, shaking, anxious, tachypnea.  Pt was sitting in chair and she was attempting to eat cracker and drink water. Pt states she took her insulin this morning without eating.  Pt cbg 90 on EMS arrival.  Vitals: 126/78, hr 110, resp 32, 98% ra.

## 2015-08-12 NOTE — ED Notes (Signed)
Discharge instructions and follow up care reviewed with patient. Patient verbalized understanding. 

## 2015-08-12 NOTE — ED Triage Notes (Signed)
Patient states that she was at the [facility] today. States her sister saw her pass out on her way to get water.  Patient is a diabetic.  Patient has a history of panic attacks and usually they are accompanied with rapid breathing, chest pain, and dizziness.

## 2015-08-12 NOTE — Discharge Instructions (Signed)
Continue taking your home medications as prescribed. Continue to monitor your sugar levels at home. Continue drinking fluids at home to remain hydrated. Call your primary care provider's office tomorrow to schedule a follow up appointment for further management and evaluation of your anxiety and diabetes. Please return to the Emergency Department if symptoms worsen or new onset of fever, headache, lightheadedness, dizziness, chest pain, difficulty breathing, abdominal pain, vomiting, diarrhea, numbness, tingling, weakness, syncope, seizure.

## 2015-08-12 NOTE — ED Provider Notes (Signed)
[facility]-EMERGENCY DEPT Provider Note   CSN: [id number] Arrival date & time: 08/12/15  1416  First Provider Contact:  First MD Initiated Contact with Patient 08/12/15 1525        History   Chief Complaint Chief Complaint  Patient presents with  . Panic Attack  . Chest Pain    HPI Leslie Jarvis is a 30 y.o. female.  Patient is a 30 year old female past medical history of anxiety, schizophrenia and type 1 diabetes who presents to the ED via EMS with complaint of chest pain. Patient reports after she left her house this afternoon [facility] due to feeling "dehydrated". She notes the [facility] did not have any water so she decided to walk to the bus station. She states after walking out of the [facility] she began to feel like she was having a panic attack. She reports having "pinching" midsternal chest pain with shortness of breath, tachypnea, fatigue and lightheadedness and notes the next thing she remembers is waking up on the lawn with her sister standing in front of her. Patient reports this episode feels similar to panic attacks she has had in the past. Patient reports taking her insulin this morning without having anything to eat or drink all day. She states her glucose level was 130 prior to administration of insulin (long acting). Patient reports since arrival to the ED her chest pain, lightheadedness, shortness of breath and fatigue have improved. She also reports having a single episode of NBNB vomiting last night. Denies fever, chills, headache, visual changes, dizziness, cough, wheezing, palpitations, abdominal pain, diarrhea, urinary symptoms, leg swelling. On EMS arrival, patient was weak, shaking, anxious and Neck. Patient was sitting in in a chair and attempting to eat crackers and drink water. CBG 90 on EMS arrival. Patient notes she was scheduled to have a PCP follow-up today. She notes she has been out of her anxiety medications for the  past 3 months.      Past Medical History:  Diagnosis Date  . Anxiety   . Schizophrenia (HCC)   . Scoliosis     There are no active problems to display for this patient.   Past Surgical History:  Procedure Laterality Date  . TUBAL LIGATION      OB History    No data available       Home Medications    Prior to Admission medications   Medication Sig Start Date End Date Taking? Authorizing Provider  metroNIDAZOLE (FLAGYL) 500 MG tablet Take 1 tablet (500 mg total) by mouth 2 (two) times daily. 06/23/15   Joycie Peek, PA-C    Family History Family History  Problem Relation Age of Onset  . Bone cancer Mother   . Anxiety disorder Father     Social History Social History  Substance Use Topics  . Smoking status: Current Every Day Smoker    Packs/day: 3.00    Types: Cigarettes  . Smokeless tobacco: Never Used  . Alcohol use No     Allergies   Latex   Review of Systems Review of Systems  Constitutional: Positive for fatigue.  Cardiovascular: Positive for chest pain.  Gastrointestinal: Positive for nausea and vomiting.  All other systems reviewed and are negative.    Physical Exam Updated Vital Signs BP 109/72 (BP Location: Right Arm)   Pulse 67   Temp 98.4 F (36.9 C) (Oral)   Resp 15   LMP 06/12/2015 (Approximate)   SpO2 100%  Physical Exam  Constitutional: She is oriented to person, place, and time. She appears well-developed and well-nourished. No distress.  HENT:  Head: Normocephalic and atraumatic. Head is without raccoon's eyes, without Battle's sign, without abrasion, without contusion and without laceration.  Right Ear: Tympanic membrane normal. No hemotympanum.  Left Ear: Tympanic membrane normal. No hemotympanum.  Nose: Nose normal. No sinus tenderness, nasal deformity, septal deviation or nasal septal hematoma. No epistaxis. Right sinus exhibits no maxillary sinus tenderness and no frontal sinus tenderness. Left sinus exhibits no  maxillary sinus tenderness and no frontal sinus tenderness.  Mouth/Throat: Uvula is midline, oropharynx is clear and moist and mucous membranes are normal. No oropharyngeal exudate, posterior oropharyngeal edema, posterior oropharyngeal erythema or tonsillar abscesses.  Eyes: Conjunctivae and EOM are normal. Pupils are equal, round, and reactive to light. Right eye exhibits no discharge. Left eye exhibits no discharge. No scleral icterus.  Neck: Normal range of motion. Neck supple.  Cardiovascular: Normal rate, regular rhythm, normal heart sounds and intact distal pulses.   Pulmonary/Chest: Effort normal and breath sounds normal. No respiratory distress. She has no wheezes. She has no rales. She exhibits tenderness (mid-anterior chest wall TTP).  Abdominal: Soft. Bowel sounds are normal. She exhibits no distension and no mass. There is no tenderness. There is no rebound and no guarding.  Musculoskeletal: Normal range of motion. She exhibits no edema.  No cervical, thoracic, or lumbar spine midline TTP. Full ROM of bilateral upper and lower extremities with 5/5 strength.   2+ radial and PT pulses. Sensation grossly intact.   Lymphadenopathy:    She has no cervical adenopathy.  Neurological: She is alert and oriented to person, place, and time. She has normal strength. No cranial nerve deficit or sensory deficit. She displays a negative Romberg sign. Coordination and gait normal.  Skin: Skin is warm and dry. She is not diaphoretic.  Nursing note and vitals reviewed.    ED Treatments / Results  Labs (all labs ordered are listed, but only abnormal results are displayed) Labs Reviewed  BASIC METABOLIC PANEL - Abnormal; Notable for the following:       Result Value   Creatinine, Ser 1.01 (*)    All other components within normal limits  CBC  PREGNANCY, URINE  I-STAT TROPOININ, ED    EKG  EKG Interpretation None       Radiology No results found.  Procedures Procedures (including  critical care time)  Medications Ordered in ED Medications - No data to display   Initial Impression / Assessment and Plan / ED Course  I have reviewed the triage vital signs and the nursing notes.  Pertinent labs & imaging results that were available during my care of the patient were reviewed by me and considered in my medical decision making (see chart for details).  Clinical Course    Patient presents with episode of chest, tachypnea, anxiety, fatigue and lightheadedness. Hx of anxiety and DM-1. Pt took her long-acting insulin this morning without eating or drinking anything after. CBG 90 on scene via EMS. VSS. Exam revealed mild midsternal chest wall tenderness, lungs clear to auscultation bilaterally, remaining exam unremarkable. No neuro deficits. No evidence of head injury or trauma. Patient reports her symptoms have improved since arrival to the ED. Pt given IV fluids. Pregnancy negative. Labs and urine unremarkable. Glucose 84. EKG showed normal sinus rhythm. Chest x-ray negative. Pt able to tolerate PO in the ED. I have a low suspicion for ACS, PE, dissection, or other acute  cardiac event at this time. I suspect patient's symptoms are likely due to to anxiety/panic attack. Patient endorses history of anxiety but notes she has been out of her medications for the past 3 months. I discussed with patient the importance of her following up with her primary care provider for further management of her anxiety and diabetes. Advised patient to continue taking her insulin as prescribed and continue monitoring her glucose levels at home. Discussed return precautions with patient.    Final Clinical Impressions(s) / ED Diagnoses   Final diagnoses:  None    New Prescriptions New Prescriptions   No medications on file     Barrett Henle, PA-C 08/12/15 1823    Doug Sou, MD 08/12/15 [phone]

## 2015-08-12 NOTE — ED Notes (Signed)
Attempted IV insertion x2. Asher Muir, RN attempted IV insertion x1. Unable to gain IV access/draw blood. Phlebotomy made aware.

## 2017-12-14 IMAGING — CR DG CHEST 2V
2 series · 2 of 2 positions shown · non-contrast
Comparison: None.

CLINICAL DATA: Chest and shortness of breath for 2 days.

EXAM:
CHEST  2 VIEW

[w chest lat]
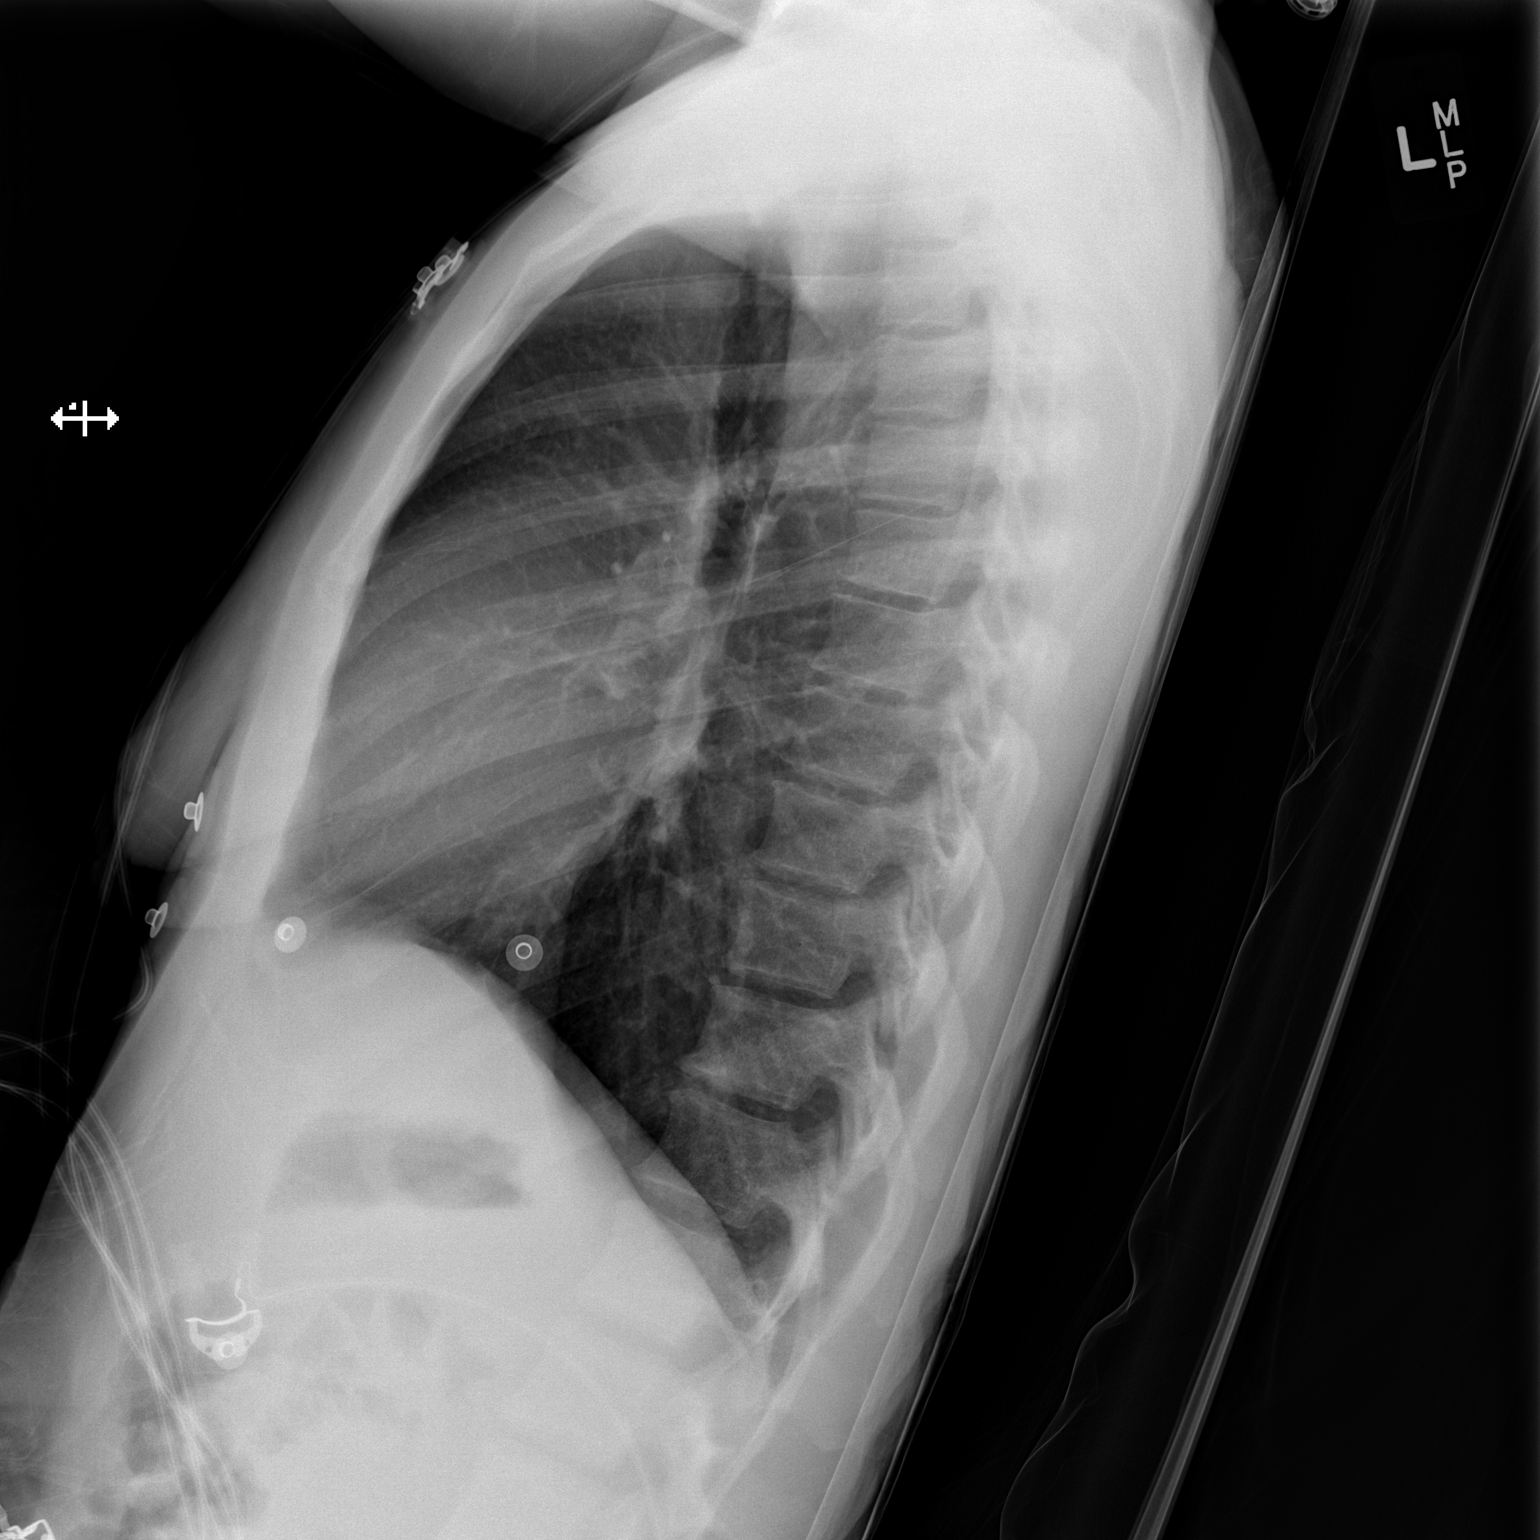

[x chest ap]
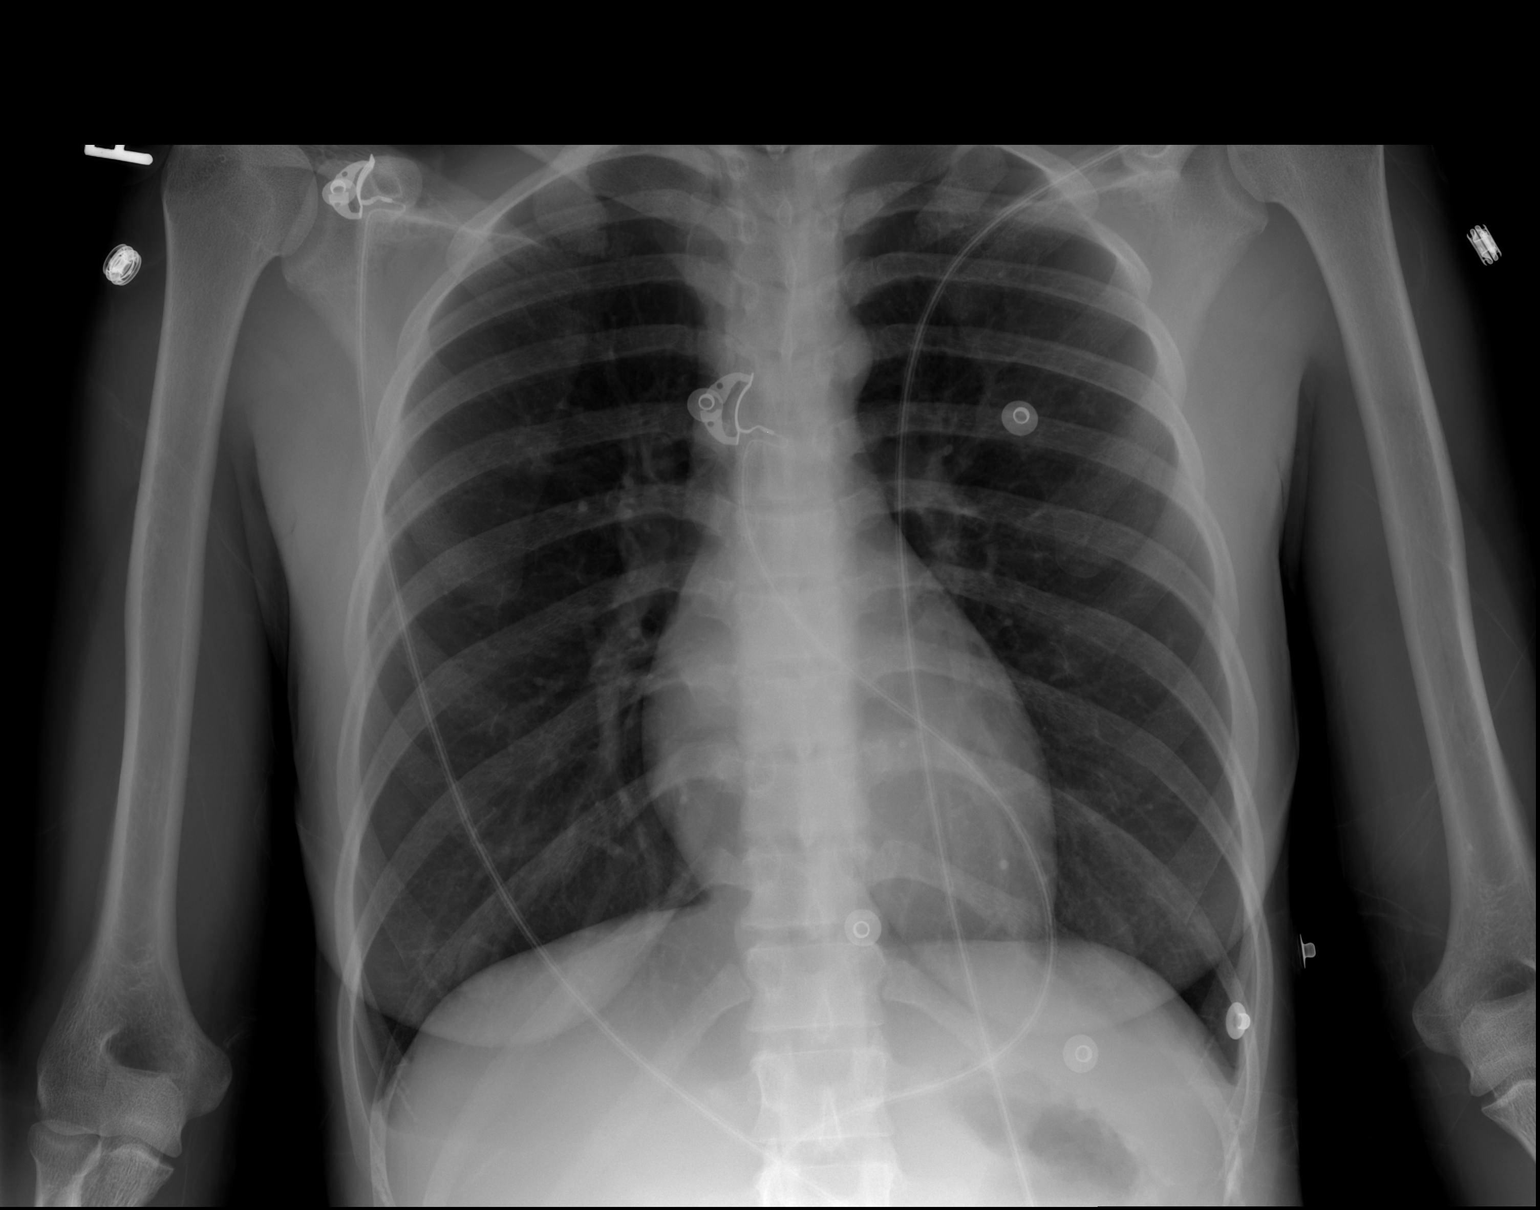

[2 of 2 positions shown; findings below may reference images not displayed]

FINDINGS: The cardiomediastinal silhouette is unremarkable.

There is no evidence of focal airspace disease, pulmonary edema,
suspicious pulmonary nodule/mass, pleural effusion, or pneumothorax.
No acute bony abnormalities are identified.
IMPRESSION: No active cardiopulmonary disease.
